# Patient Record
Sex: Male | Born: 1965 | Race: Asian | Hispanic: No | Marital: Married
Health system: Southern US, Academic
[De-identification: ages and names within clinical notes are randomized; demographics above are authoritative.]

## PROBLEM LIST (undated history)

## (undated) ENCOUNTER — Encounter: Attending: Nurse Practitioner | Primary: Nurse Practitioner

## (undated) ENCOUNTER — Telehealth

## (undated) ENCOUNTER — Encounter: Attending: Hematology & Oncology | Primary: Hematology & Oncology

## (undated) ENCOUNTER — Ambulatory Visit

## (undated) ENCOUNTER — Ambulatory Visit: Payer: PRIVATE HEALTH INSURANCE

## (undated) ENCOUNTER — Encounter

## (undated) ENCOUNTER — Ambulatory Visit: Payer: MEDICARE

## (undated) ENCOUNTER — Encounter
Attending: Student in an Organized Health Care Education/Training Program | Primary: Student in an Organized Health Care Education/Training Program

## (undated) ENCOUNTER — Ambulatory Visit: Attending: Radiation Oncology | Primary: Radiation Oncology

## (undated) ENCOUNTER — Ambulatory Visit: Payer: BLUE CROSS/BLUE SHIELD

## (undated) ENCOUNTER — Ambulatory Visit: Payer: PRIVATE HEALTH INSURANCE | Attending: Hematology & Oncology | Primary: Hematology & Oncology

## (undated) ENCOUNTER — Encounter: Payer: PRIVATE HEALTH INSURANCE | Attending: Hematology & Oncology | Primary: Hematology & Oncology

## (undated) ENCOUNTER — Ambulatory Visit
Attending: Student in an Organized Health Care Education/Training Program | Primary: Student in an Organized Health Care Education/Training Program

## (undated) ENCOUNTER — Encounter: Attending: Oncology | Primary: Oncology

## (undated) ENCOUNTER — Telehealth: Attending: Hematology & Oncology | Primary: Hematology & Oncology

## (undated) ENCOUNTER — Telehealth: Attending: Internal Medicine | Primary: Internal Medicine

## (undated) ENCOUNTER — Encounter: Attending: Gastroenterology | Primary: Gastroenterology

## (undated) ENCOUNTER — Encounter
Attending: Pharmacist Clinician (PhC)/ Clinical Pharmacy Specialist | Primary: Pharmacist Clinician (PhC)/ Clinical Pharmacy Specialist

## (undated) ENCOUNTER — Telehealth
Attending: Student in an Organized Health Care Education/Training Program | Primary: Student in an Organized Health Care Education/Training Program

## (undated) ENCOUNTER — Encounter: Attending: Pharmacist | Primary: Pharmacist

## (undated) ENCOUNTER — Telehealth: Attending: Surgery | Primary: Surgery

## (undated) ENCOUNTER — Telehealth: Attending: Family | Primary: Family

## (undated) ENCOUNTER — Encounter: Attending: Surgery | Primary: Surgery

## (undated) ENCOUNTER — Encounter: Attending: Diagnostic Radiology | Primary: Diagnostic Radiology

## (undated) ENCOUNTER — Ambulatory Visit
Payer: PRIVATE HEALTH INSURANCE | Attending: Student in an Organized Health Care Education/Training Program | Primary: Student in an Organized Health Care Education/Training Program

## (undated) ENCOUNTER — Ambulatory Visit: Payer: BLUE CROSS/BLUE SHIELD | Attending: Hematology & Oncology | Primary: Hematology & Oncology

## (undated) ENCOUNTER — Other Ambulatory Visit

## (undated) ENCOUNTER — Ambulatory Visit: Payer: PRIVATE HEALTH INSURANCE | Attending: Acute Care | Primary: Acute Care

## (undated) ENCOUNTER — Inpatient Hospital Stay: Payer: PRIVATE HEALTH INSURANCE

## (undated) ENCOUNTER — Telehealth: Attending: Nurse Practitioner | Primary: Nurse Practitioner

## (undated) ENCOUNTER — Ambulatory Visit: Payer: PRIVATE HEALTH INSURANCE | Attending: Surgery | Primary: Surgery

## (undated) ENCOUNTER — Ambulatory Visit: Attending: Hematology & Oncology | Primary: Hematology & Oncology

## (undated) ENCOUNTER — Ambulatory Visit: Payer: Medicare (Managed Care)

## (undated) ENCOUNTER — Encounter: Attending: Radiation Oncology | Primary: Radiation Oncology

## (undated) ENCOUNTER — Ambulatory Visit: Payer: PRIVATE HEALTH INSURANCE | Attending: Nurse Practitioner | Primary: Nurse Practitioner

---

## 2015-02-07 ENCOUNTER — Ambulatory Visit
Admission: RE | Admit: 2015-02-07 | Discharge: 2015-02-07 | Disposition: A | Discharge status: Home / Self Care | Payer: Self-pay | Source: Ambulatory Visit | Attending: Internal Medicine | Admitting: Internal Medicine

## 2015-02-07 ENCOUNTER — Other Ambulatory Visit: Payer: Self-pay | Admitting: Internal Medicine

## 2015-02-07 DIAGNOSIS — Z111 Encounter for screening for respiratory tuberculosis: Secondary | ICD-10-CM

## 2015-02-07 DIAGNOSIS — R05 Cough: Secondary | ICD-10-CM

## 2015-02-07 DIAGNOSIS — R059 Cough, unspecified: Secondary | ICD-10-CM

## 2017-03-09 IMAGING — CR DG CHEST 2V
2 series · 2 of 2 positions shown · non-contrast
Comparison: None.

CLINICAL DATA: Evaluate for TB.  Cough.

EXAM:
CHEST  2 VIEW

[w chest pa]
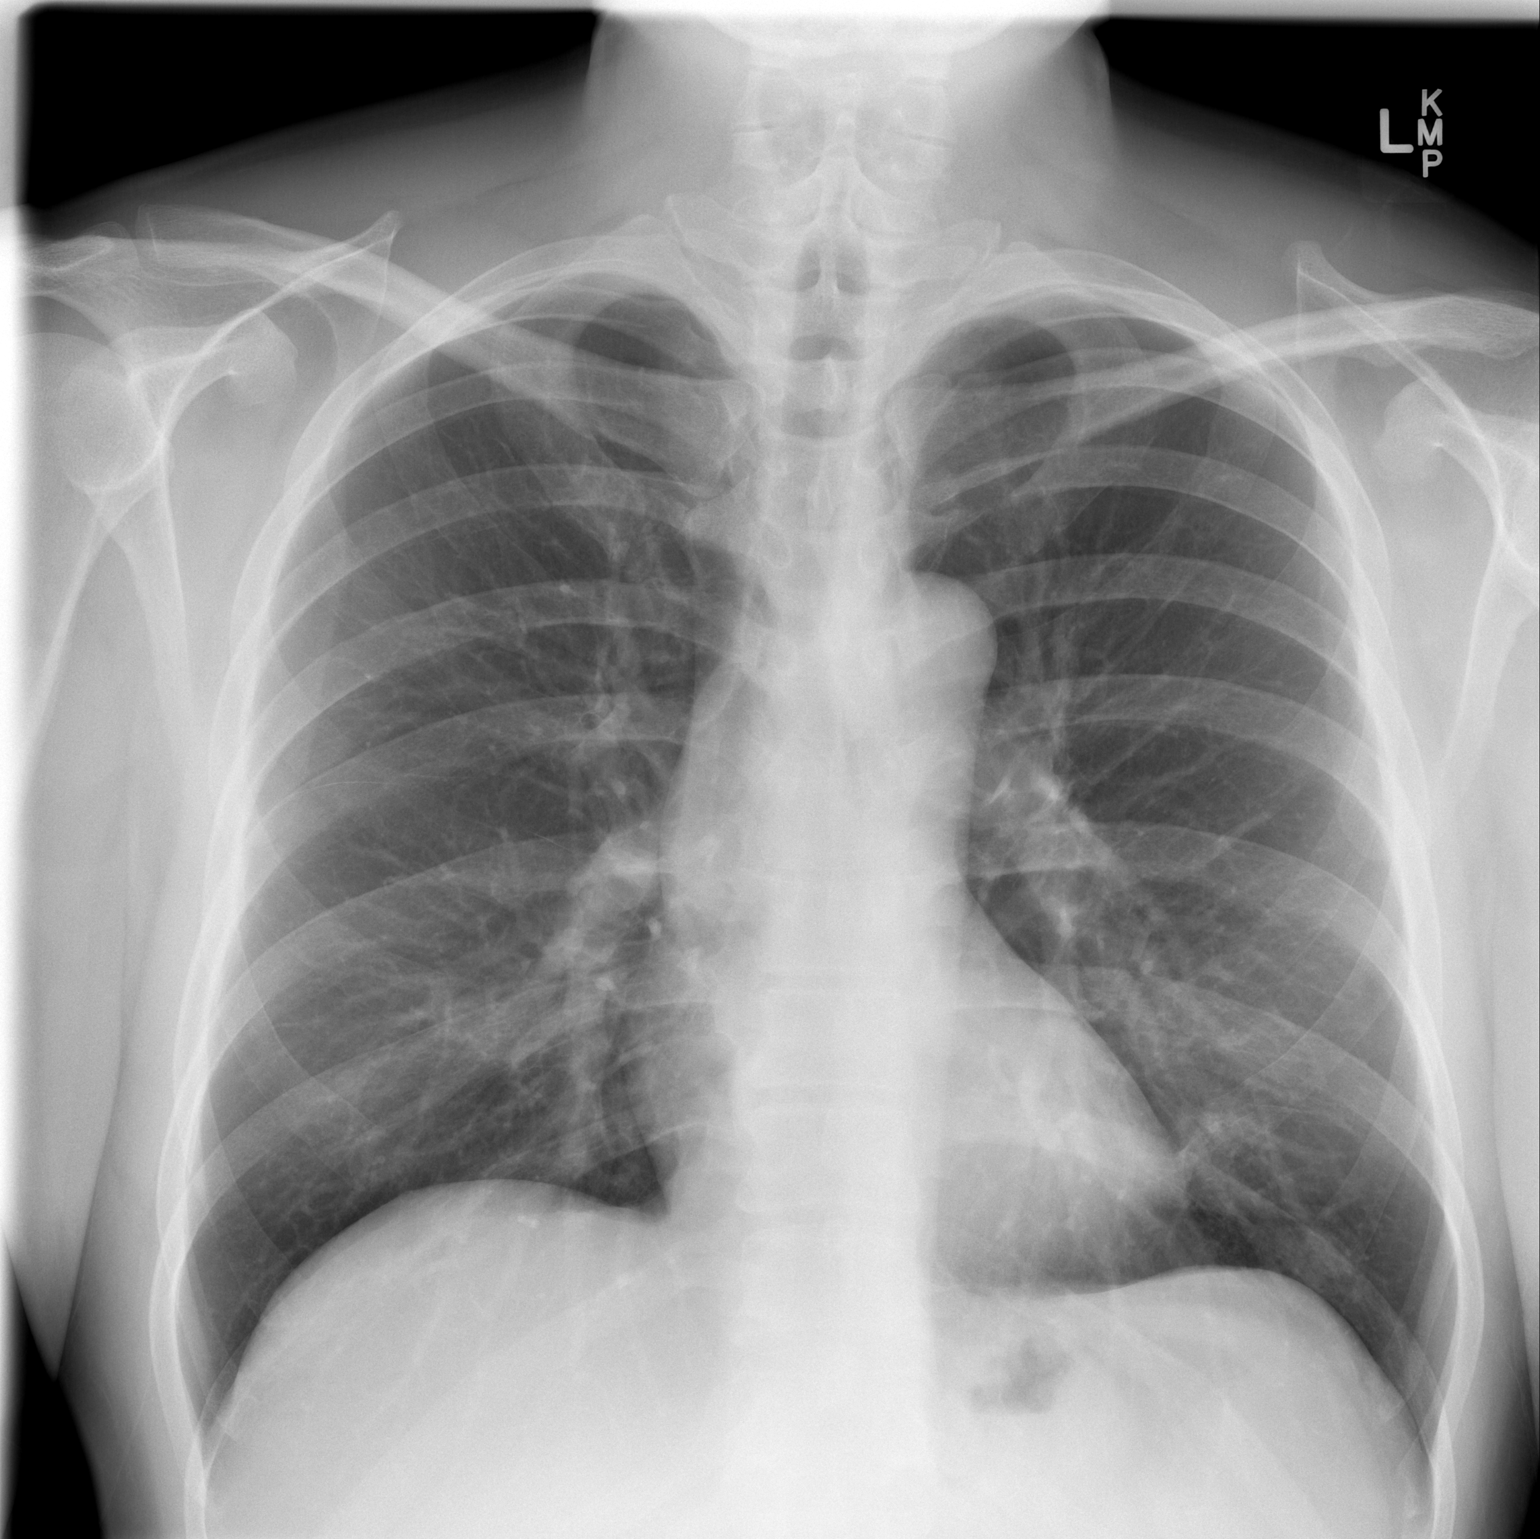

[w chest lat]
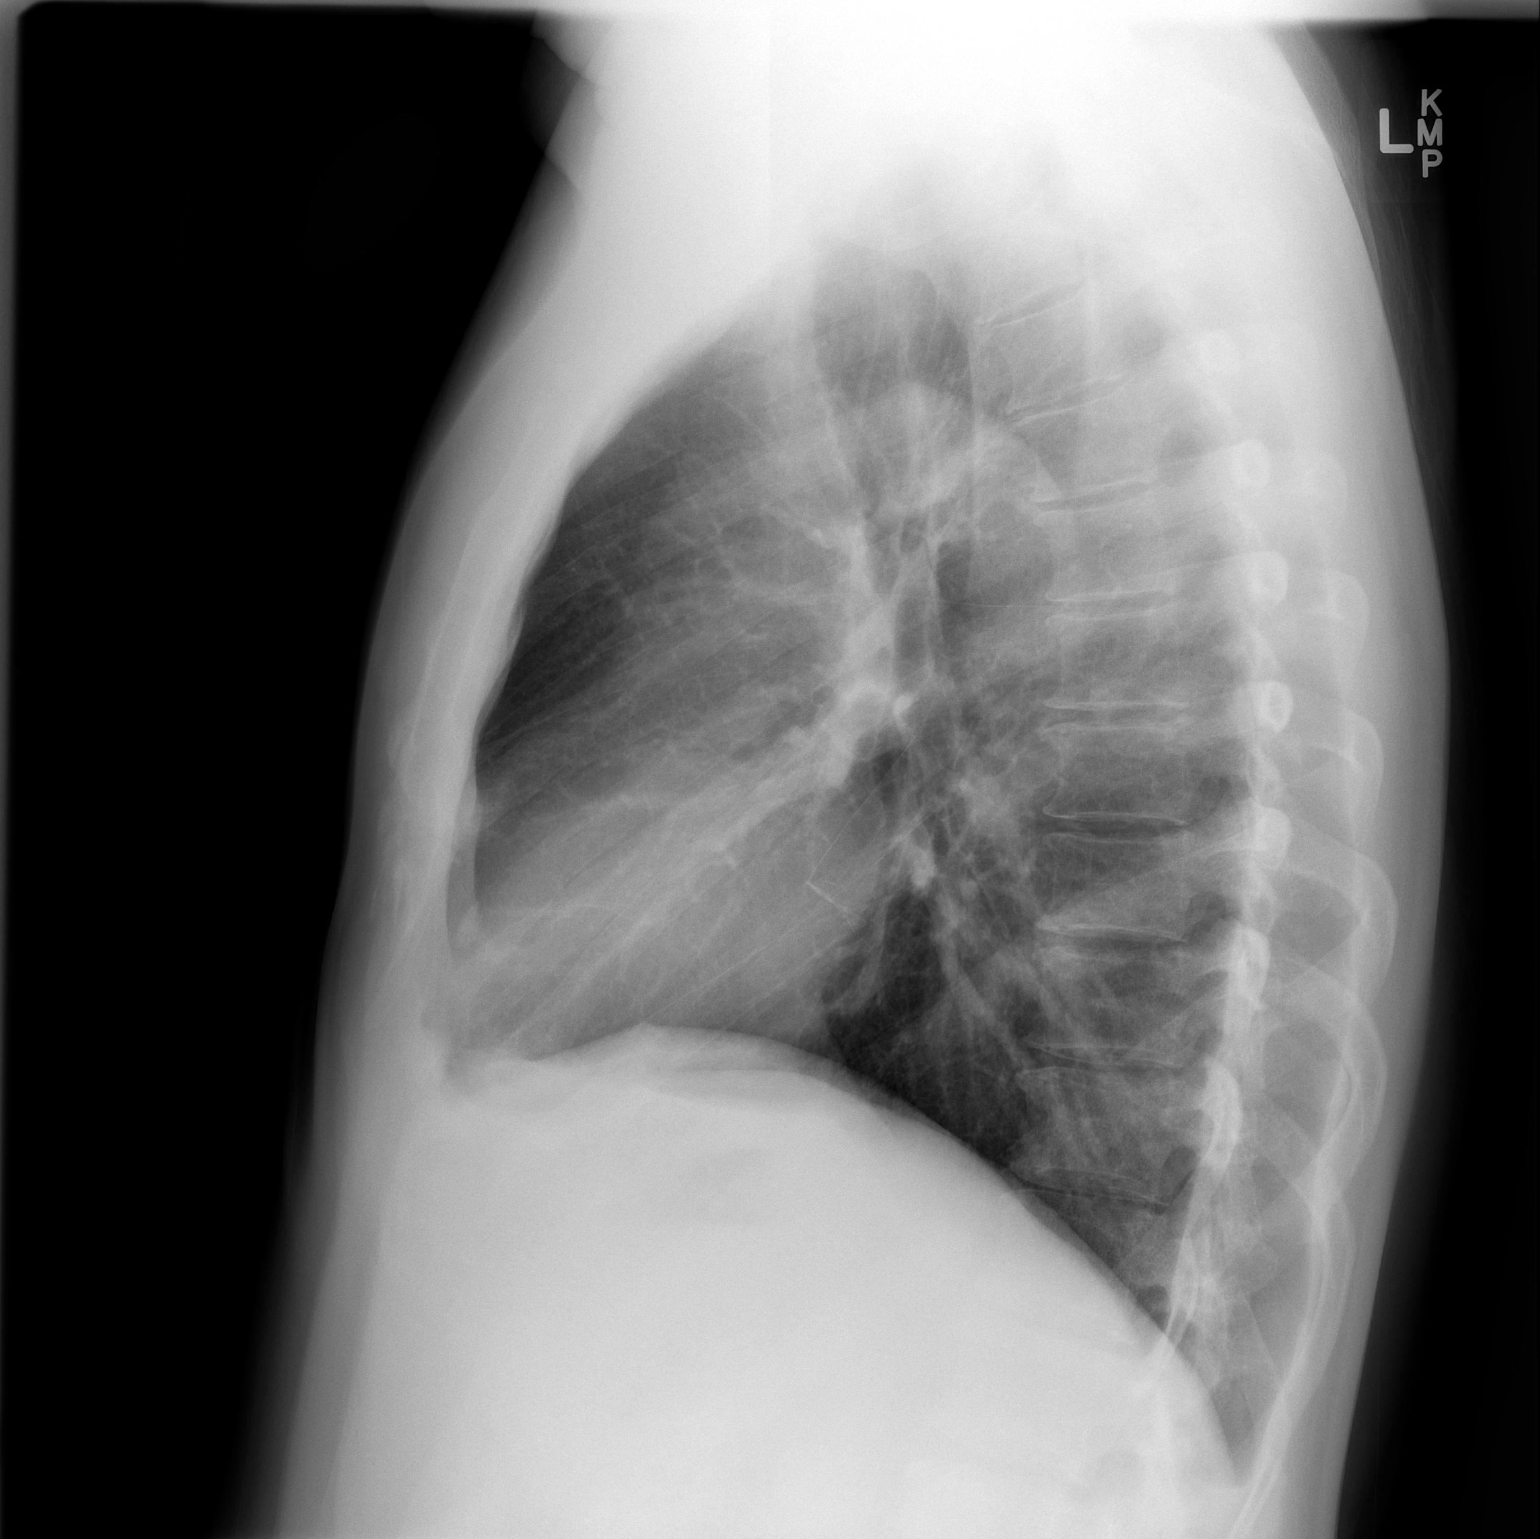

[2 of 2 positions shown; findings below may reference images not displayed]

FINDINGS: Mediastinum hilar structures are normal. Mild lingular subsegmental
atelectasis versus mild infiltrate. No pleural effusion or
pneumothorax. Heart size normal.
IMPRESSION: Mild lingular subsegmental atelectasis versus mild infiltrate .

## 2017-09-30 ENCOUNTER — Telehealth: Payer: Self-pay | Admitting: Student in an Organized Health Care Education/Training Program

## 2017-10-01 NOTE — Telephone Encounter (Signed)
err

## 2018-02-23 ENCOUNTER — Encounter
Admit: 2018-02-23 | Discharge: 2018-02-23 | Payer: PRIVATE HEALTH INSURANCE | Attending: Student in an Organized Health Care Education/Training Program | Primary: Student in an Organized Health Care Education/Training Program

## 2018-02-23 ENCOUNTER — Encounter: Admit: 2018-02-23 | Discharge: 2018-02-23 | Payer: PRIVATE HEALTH INSURANCE

## 2018-02-23 DIAGNOSIS — Z0000 Encounter for general adult medical examination without abnormal findings: Secondary | ICD-10-CM

## 2018-02-23 DIAGNOSIS — Z1211 Encounter for screening for malignant neoplasm of colon: Principal | ICD-10-CM

## 2018-02-23 DIAGNOSIS — Z8739 Personal history of other diseases of the musculoskeletal system and connective tissue: Secondary | ICD-10-CM

## 2018-02-23 DIAGNOSIS — Z13220 Encounter for screening for lipoid disorders: Secondary | ICD-10-CM

## 2018-02-23 DIAGNOSIS — M1A09X0 Idiopathic chronic gout, multiple sites, without tophus (tophi): Secondary | ICD-10-CM

## 2018-02-25 MED ORDER — ALLOPURINOL 100 MG TABLET
ORAL_TABLET | Freq: Every day | ORAL | 3 refills | 0.00000 days | Status: SS
Start: 2018-02-25 — End: 2018-03-18

## 2018-02-25 MED ORDER — COLCHICINE 0.6 MG TABLET
ORAL_TABLET | Freq: Every day | ORAL | 2 refills | 0 days | Status: CP
Start: 2018-02-25 — End: 2018-08-10

## 2018-03-04 ENCOUNTER — Encounter: Admit: 2018-03-04 | Discharge: 2018-03-04 | Payer: PRIVATE HEALTH INSURANCE

## 2018-03-06 ENCOUNTER — Encounter: Admit: 2018-03-06 | Discharge: 2018-03-07 | Payer: PRIVATE HEALTH INSURANCE

## 2018-03-06 ENCOUNTER — Encounter: Admit: 2018-03-06 | Discharge: 2018-03-07 | Payer: PRIVATE HEALTH INSURANCE | Attending: Surgery | Primary: Surgery

## 2018-03-06 DIAGNOSIS — C184 Malignant neoplasm of transverse colon: Principal | ICD-10-CM

## 2018-03-06 DIAGNOSIS — C189 Malignant neoplasm of colon, unspecified: Principal | ICD-10-CM

## 2018-03-06 DIAGNOSIS — Z8739 Personal history of other diseases of the musculoskeletal system and connective tissue: Secondary | ICD-10-CM

## 2018-03-07 ENCOUNTER — Encounter
Admit: 2018-03-07 | Discharge: 2018-03-07 | Disposition: A | Discharge status: Home / Self Care | Payer: PRIVATE HEALTH INSURANCE | Attending: Emergency Medicine

## 2018-03-10 ENCOUNTER — Encounter
Admit: 2018-03-10 | Discharge: 2018-03-10 | Payer: PRIVATE HEALTH INSURANCE | Attending: Anatomic Pathology | Primary: Anatomic Pathology

## 2018-03-10 DIAGNOSIS — C189 Malignant neoplasm of colon, unspecified: Principal | ICD-10-CM

## 2018-03-10 MED ORDER — POLYETHYLENE GLYCOL 3350 17 GRAM/DOSE ORAL POWDER
0 refills | 0 days | Status: SS
Start: 2018-03-10 — End: 2018-03-18

## 2018-03-10 MED ORDER — NEOMYCIN 500 MG TABLET
ORAL_TABLET | 0 refills | 0 days | Status: SS
Start: 2018-03-10 — End: 2018-03-18

## 2018-03-10 MED ORDER — ERYTHROMYCIN 500 MG TABLET
ORAL_TABLET | 0 refills | 0 days | Status: SS
Start: 2018-03-10 — End: 2018-03-18

## 2018-03-10 MED ORDER — BISACODYL 5 MG TABLET
ORAL_TABLET | 0 refills | 0 days | Status: SS
Start: 2018-03-10 — End: 2018-03-18

## 2018-03-16 DIAGNOSIS — C187 Malignant neoplasm of sigmoid colon: Principal | ICD-10-CM

## 2018-03-17 ENCOUNTER — Encounter
Admit: 2018-03-17 | Discharge: 2018-03-20 | Disposition: A | Discharge status: Home / Self Care | Payer: PRIVATE HEALTH INSURANCE | Attending: Student in an Organized Health Care Education/Training Program | Admitting: Surgery

## 2018-03-17 ENCOUNTER — Ambulatory Visit
Admit: 2018-03-17 | Discharge: 2018-03-20 | Disposition: A | Discharge status: Home / Self Care | Payer: PRIVATE HEALTH INSURANCE | Admitting: Surgery

## 2018-03-17 DIAGNOSIS — C187 Malignant neoplasm of sigmoid colon: Principal | ICD-10-CM

## 2018-03-20 MED ORDER — OXYCODONE 5 MG TABLET
ORAL_TABLET | ORAL | 0 refills | 0.00000 days | Status: CP | PRN
Start: 2018-03-20 — End: 2018-03-25

## 2018-03-27 ENCOUNTER — Encounter: Admit: 2018-03-27 | Discharge: 2018-03-28 | Payer: PRIVATE HEALTH INSURANCE | Attending: Surgery | Primary: Surgery

## 2018-03-27 ENCOUNTER — Encounter
Admit: 2018-03-27 | Discharge: 2018-03-28 | Payer: PRIVATE HEALTH INSURANCE | Attending: Hematology & Oncology | Primary: Hematology & Oncology

## 2018-03-27 DIAGNOSIS — D500 Iron deficiency anemia secondary to blood loss (chronic): Secondary | ICD-10-CM

## 2018-03-27 DIAGNOSIS — Z09 Encounter for follow-up examination after completed treatment for conditions other than malignant neoplasm: Principal | ICD-10-CM

## 2018-03-27 DIAGNOSIS — C187 Malignant neoplasm of sigmoid colon: Principal | ICD-10-CM

## 2018-03-31 MED ORDER — CAPECITABINE 500 MG TABLET
ORAL_TABLET | Freq: Two times a day (BID) | ORAL | 3 refills | 0.00000 days | Status: CP
Start: 2018-03-31 — End: 2018-04-01

## 2018-03-31 MED ORDER — CAPECITABINE 500 MG TABLET: 850 mg/m2 | tablet | Freq: Two times a day (BID) | 3 refills | 0 days | Status: AC

## 2018-04-01 MED ORDER — CAPECITABINE 500 MG TABLET
ORAL_TABLET | Freq: Two times a day (BID) | ORAL | 3 refills | 0 days | Status: CP
Start: 2018-04-01 — End: 2018-06-29
  Filled 2018-04-07: qty 112, 14d supply, fill #0

## 2018-04-01 NOTE — Unmapped (Signed)
Updated prescription requested from [facility] for full cycle of capecitabine (to be given as part of CapeOx regimen). Prescription routed as requested.    Care coordination: 10 minutes    Konrad Penta, PharmD, BCOP, CPP  Clinical Pharmacist Practitioner, Gastrointestinal Oncology  Pager: [phone]

## 2018-04-02 NOTE — Unmapped (Signed)
[facility] Special[facility] Medication Referral: PA APPROVED    Medication (Brand/Generic): CAPECITABINE    Initial Test Claim completed with resulted information below:  No PA required  Patient ABLE to fill at [facility]  Insurance Company:  BCBS  Anticipated Copay: $0  Is anticipated copay with a copay card or grant? NO    As Co-pay is under $25 defined limit, per policy there will be no further investigation of need for financial assistance at this time unless patient requests. This referral has been communicated to the provider and handed off to the [facility] team for further processing and filling of prescribed medication.   ______________________________________________________________________  Please utilize this referral for viewing purposes as it will serve as the central location for all relevant documentation and updates.

## 2018-04-06 MED ORDER — FEBUXOSTAT 40 MG TABLET
ORAL_TABLET | Freq: Every day | ORAL | 3 refills | 0.00000 days | Status: CP
Start: 2018-04-06 — End: 2018-04-06

## 2018-04-06 MED ORDER — FEBUXOSTAT 40 MG TABLET: 40 mg | tablet | Freq: Every day | 3 refills | 0 days | Status: AC

## 2018-04-06 NOTE — Unmapped (Signed)
Test claim sent for benefits investigation only for medication febuxostat.    Care coordination: 5 minutes    Konrad Penta, PharmD, BCOP, CPP  Clinical Pharmacist Practitioner, Gastrointestinal Oncology  Pager: [phone]

## 2018-04-06 NOTE — Unmapped (Signed)
Pharmacy Telephone Encounter    I called and spoke with [name] regarding drug-drug and drug-herbal interactions. Instructed the patient to stop taking his allopurinol today as it will potentially interact with and decrease the efficacy of his capecitabine, which he will start taking on 04/09/18 in the evening. Pharmacy team will reach out to Dr. Forbes Cellar with recommendations to start febuxostat for gout prevention. Drug-herbal interactions were not identified but since these products have a lack of strict regulation and limited data with our chemotherapy plan, it is unknown exactly how safety/efficacy will be impacted so this point was reiterated (also with patient on 03/27/18).    Daleen Snook, PharmD  PGY2 Hematology/Oncology Pharmacy Resident  Pager: [phone]    I discussed the patient with the resident and agree with the plan as documented.    Konrad Penta, PharmD, BCOP, CPP  Clinical Pharmacist Practitioner, Gastrointestinal Oncology  Pager: [phone]

## 2018-04-06 NOTE — Unmapped (Signed)
[facility]) Pharmacy Onboarding    Mr.Mosey is a 52 y.o. male who was counseled on initiation of CAPEOX (capecitabine specialty medication). Patient's date of birth/HIPAA verified.    Diagnosis: Colon Cancer    Related to appropriateness of therapy, the following patient information was reviewed: diagnosis, contraindications, precautions, comorbidities, and allergies. Patient will receive a Lexi-Comp drug information handout with shipment. Plan to convert patient from allopurinol to febuxostat to avoid drug-drug interaction with capecitabine.    Patient Specific Needs     ?? Patient speaks [place], an interpreter is not needed.  ?? Patient is able to read and understand education materials at a high school level or above.  ?? Patient does not have any cultural barriers.  ?? Patient has no cognitive or physical impairments.    Medication Acquisition     Anticipated copay discussed with patient: $0    MAP Involvement:  ?? Prior authorization: Yes   ?? Copay assistance: n/a  ?? Grant assistance: n/a    SSC and Delivery Information     Patient will receive a medication information handout and a Herbalist with their shipment.    Patient informed that shipment will be delivered on 04/08/18 via UPS and patient will not have to sign for the package.    Delivery address verified:  [address]  [place]  [address]    Reviewed the services that the [facility] will provide and how to contact them [phone] option 4).  A representative from the pharmacy will contact the patient to remind them to set up their refill 7-10 days prior to the patient running out of medication. Emphasized the importance of answering or returning phone calls from the [facility] to prevent delays in therapy.  The pharmacy MUST speak to the patient to schedule the refill.    Patient informed that a pharmacist is available Monday through Friday 8:30 am to 4:30 pm. A pharmacist is available via pager 24/7 to answer any clinical questions regarding the Medication.    Medication Education     Please see progress note from 03/27/18 for medication-specific counseling session.    Daleen Snook, PharmD  PGY2 Hematology/Oncology Pharmacy Resident  Pager: [phone]    I discussed the patient with the resident and agree with the plan as documented.    Konrad Penta, PharmD, BCOP, CPP  Clinical Pharmacist Practitioner, Gastrointestinal Oncology  Pager: [phone]

## 2018-04-07 MED ORDER — FEBUXOSTAT 40 MG TABLET
ORAL_TABLET | Freq: Every day | ORAL | 1 refills | 0.00000 days | Status: CP
Start: 2018-04-07 — End: 2018-05-07

## 2018-04-07 MED FILL — CAPECITABINE 500 MG TABLET: 14 days supply | Qty: 112 | Fill #0 | Status: AC

## 2018-04-07 NOTE — Unmapped (Signed)
Patient plans to talk with PCP prior to initiating urate lowering therapy, but Uloric (febuxostat) was sent to patient's preferred pharmacy ([facility]). Please see telephone encounter note from 04/06/18 for further details.    Counseled on the increased risk of CV-related death in patients treated with febuxostat if they have established CV disease (compared to those treated with allopurinol). Counseled on the need for continued gout flare prophylaxis when initiating febuxostat in order to help prevent gout flares.    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 Also, he is not going to begin taking turmeric or the herbal product ashwagandha given the unknown effects related to our cancer treatment plan.    Care coordination: 15 minutes    Konrad Penta, PharmD, BCOP, CPP  Clinical Pharmacist Practitioner, Gastrointestinal Oncology  Pager: [phone]

## 2018-04-07 NOTE — Unmapped (Signed)
Sent request to [facility] to schedule delivery of capecitabine with arrival expected on 04/08/18 (details included below):    [facility] Scheduling Note    Specialty Medication(s) to be Shipped (name and strength):   Hematology/Oncology: Capecitabine 500 mg    Other medication(s) to be shipped: N/A    Mark Calhoun, DOB: 02-05-1966  Phone: [phone] (home) [phone] (work)  Shipping Address: [address]  [place] [address]    Date Patient Should Receive Medication: 04/08/18    Delivery notes:    Signature not required   UPS    Care coordination: 5 minutes    Konrad Penta, PharmD, BCOP, CPP  Clinical Pharmacist Practitioner, Gastrointestinal Oncology  Pager: [phone]

## 2018-04-09 ENCOUNTER — Encounter: Admit: 2018-04-09 | Discharge: 2018-04-09 | Payer: PRIVATE HEALTH INSURANCE

## 2018-04-09 ENCOUNTER — Encounter
Admit: 2018-04-09 | Discharge: 2018-04-09 | Payer: PRIVATE HEALTH INSURANCE | Attending: Hematology & Oncology | Primary: Hematology & Oncology

## 2018-04-09 DIAGNOSIS — C187 Malignant neoplasm of sigmoid colon: Principal | ICD-10-CM

## 2018-04-09 DIAGNOSIS — D500 Iron deficiency anemia secondary to blood loss (chronic): Secondary | ICD-10-CM

## 2018-04-09 LAB — MAGNESIUM: Magnesium:MCnc:Pt:Ser/Plas:Qn:: 2

## 2018-04-09 LAB — CBC W/ AUTO DIFF
BASOPHILS ABSOLUTE COUNT: 0 10*9/L (ref 0.0–0.1)
BASOPHILS RELATIVE PERCENT: 0.8 %
EOSINOPHILS ABSOLUTE COUNT: 0.2 10*9/L (ref 0.0–0.4)
EOSINOPHILS RELATIVE PERCENT: 3.9 %
HEMATOCRIT: 33.4 % — ABNORMAL LOW (ref 41.0–53.0)
HEMOGLOBIN: 9.6 g/dL — ABNORMAL LOW (ref 13.5–17.5)
LARGE UNSTAINED CELLS: 3 % (ref 0–4)
LYMPHOCYTES ABSOLUTE COUNT: 1 10*9/L — ABNORMAL LOW (ref 1.5–5.0)
LYMPHOCYTES RELATIVE PERCENT: 25.2 %
MEAN CORPUSCULAR HEMOGLOBIN CONC: 28.8 g/dL — ABNORMAL LOW (ref 31.0–37.0)
MEAN CORPUSCULAR HEMOGLOBIN: 19.7 pg — ABNORMAL LOW (ref 26.0–34.0)
MEAN CORPUSCULAR VOLUME: 68.3 fL — ABNORMAL LOW (ref 80.0–100.0)
MEAN PLATELET VOLUME: 6.3 fL — ABNORMAL LOW (ref 7.0–10.0)
MONOCYTES ABSOLUTE COUNT: 0.3 10*9/L (ref 0.2–0.8)
NEUTROPHILS ABSOLUTE COUNT: 2.5 10*9/L (ref 2.0–7.5)
NEUTROPHILS RELATIVE PERCENT: 61.1 %
RED BLOOD CELL COUNT: 4.88 10*12/L (ref 4.50–5.90)
RED CELL DISTRIBUTION WIDTH: 22.4 % — ABNORMAL HIGH (ref 12.0–15.0)
WBC ADJUSTED: 4.1 10*9/L — ABNORMAL LOW (ref 4.5–11.0)

## 2018-04-09 LAB — RED BLOOD CELL COUNT: Lab: 4.88

## 2018-04-09 LAB — COMPREHENSIVE METABOLIC PANEL
ALBUMIN: 4.8 g/dL (ref 3.5–5.0)
ALKALINE PHOSPHATASE: 52 U/L (ref 38–126)
ALT (SGPT): 24 U/L (ref 19–72)
ANION GAP: 10 mmol/L (ref 7–15)
AST (SGOT): 26 U/L (ref 19–55)
BILIRUBIN TOTAL: 0.8 mg/dL (ref 0.0–1.2)
BLOOD UREA NITROGEN: 14 mg/dL (ref 7–21)
BUN / CREAT RATIO: 15
CALCIUM: 9.8 mg/dL (ref 8.5–10.2)
CO2: 28 mmol/L (ref 22.0–30.0)
CREATININE: 0.93 mg/dL (ref 0.70–1.30)
EGFR CKD-EPI AA MALE: >90 mL/min/{1.73_m2} (ref >=60)
EGFR CKD-EPI NON-AA MALE: >90 mL/min/{1.73_m2} (ref >=60)
GLUCOSE RANDOM: 99 mg/dL (ref 65–179)
POTASSIUM: 4.6 mmol/L (ref 3.5–5.0)
PROTEIN TOTAL: 7.8 g/dL (ref 6.5–8.3)
SODIUM: 141 mmol/L (ref 135–145)

## 2018-04-09 LAB — CHLORIDE: Chloride:SCnc:Pt:Ser/Plas:Qn:: 103

## 2018-04-09 LAB — CARCINOEMBRYONIC ANTIGEN: Carcinoembryonic Ag:MCnc:Pt:Ser/Plas:Qn:: 1.6

## 2018-04-09 LAB — OVALOCYTES

## 2018-04-09 MED ORDER — PROCHLORPERAZINE MALEATE 10 MG TABLET
ORAL_TABLET | Freq: Four times a day (QID) | ORAL | 6 refills | 0.00000 days | Status: CP | PRN
Start: 2018-04-09 — End: ?

## 2018-04-09 MED ORDER — ONDANSETRON HCL 8 MG TABLET
ORAL_TABLET | Freq: Two times a day (BID) | ORAL | 6 refills | 0 days | Status: CP | PRN
Start: 2018-04-09 — End: 2019-04-09

## 2018-04-09 NOTE — Unmapped (Signed)
VS taken. Labs drawn via [facility] PIV.    PIV flushed and brisk blood return     Premeds and IV fluids given.    Pt received Oxaliplatin - no adverse events noted.     Premeds given.    Pt received Fereheme infusion - no adverse events noted.     AVS refused.    Pt stable and ambulated independently from clinic.

## 2018-04-09 NOTE — Unmapped (Signed)
[facility] MEDICAL ONCOLOG[facility]     PRIMARY CARE PROVIDER:  Lynnell Jude, MD  [address]  [place] [address]    CONSULTING PROVIDERS  Dr. Nemiah Commander, [facility] Colorectal Surgery   ____________________________________________________________________    CANCER HISTORY  Diagnosis 03/2018 stage III colon cancer presenting with severe anemia   03/17/18 lap left/sigmoid colectomy: pT3N1b (3/42) moderately differentiated adenocarcinoma, discontig tumor nodules present, intermediate tumor budding,  IHC intact for MMR enzymes (MLH1, MSH2, MSH6, PMS2). preop CEA 4.2   04/09/18: CapeOX (planned 3 month duration)  ____________________________________________________________________    ASSESSMENT  1. Stage IIIB MSS sigmoid colon cancer  2. Indeterminate liver hypodensities, likely benign  3,  Iron deficiency anemia  4. Gout, imrpvoed  5. ECOG PS 0      RECOMMENDATIONS  1. Adjuvant chemotherapy: 3 months of CapeOX. Consider additional 3 months of single cape  2. Liver MRI at completion of 3 month adjuvant therapy  3. ron infusion with cycle 1  3 rtc in 3 weeks for cycle 2    DISCUSSION  Reviewed capeox adverse effects, what to expect with treatment, and how to take again. Rx [facility] ondansetron and [facility]     Plan for IV iron given very low ferritin even posttransfusion    We will readdress the 3 v 6 month question as we near the end of the 3 months        ______________________________________________________________________  HISTORY     Mark Calhoun is seen today at the [facility] for consultation regarding colon cancer at the request of Dr. Elenore Rota.     Mark Calhoun is feeling well. Eating very health--lots of veggies and fruit. He is walking 2-3 miles 1 or two times a day. No trouble with dyspnea with that. Incisions dont hurt. His energy is getting closer to his normal self.   Bowels doing well-- two soft bm a day which is up from usual 1, but not a problem for him.     His gout on his foot has resolved. Feels normal again.    MEDICAL HISTORY  Gout-- has only had a few flares over the years.     No Known Allergies  Medications reviewed in the EMR    SOCIAL HISTORY  Current: coems with his wife. 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 His brother [name] is my partner in medical oncology. Shawndell is married and has two teenage daughters (17 and 69 as of 03/2018).   REVIEW OF SYSTEMS  The remainder of a comprehensive 10 systems review is negative.    PHYSICAL EXAM  BP 130/77  - Temp 36.8 ??C (98.3 ??F) (Oral)  - Wt 91.1 kg (200 lb 12.8 oz)  - SpO2 100%  - BMI 28.02 kg/m??    GENERAL: well developed, well nourished, in no distress  PSYCH: full and appropriate range of affect with good insight and judgement  HEENT: NCAT, pupils equal, sclerae anicteric,   GI:  Incisions essentially healed. Still with some suture glue adhereent. No erythema or drainage  EXT: nl  SKIN: No rashes    OBJECTIVE DATA    hgb improing but still in the 9s.   Everything else is great.     RADIOLOGY RESULTS (scans are personally reviewed by me)  none

## 2018-04-09 NOTE — Unmapped (Signed)
Pharmacy: First Cycle Chemotherapy Patient Education    Chemotherapy regimen/agents: capecitabine/oxaliplatin  Day 1 of chemotherapy: 04/09/18    I counseled Mark Calhoun about his new chemotherapy regimen.  He was previously counseled about his capecitabine by Konrad Penta.      Counseling:     Handout(s) provided from:  [facility] patient education [employer].    Adverse effects discussed included the following:      Neutropenia/Febrile Neutropenia  Thrombocytopenia  Anemia  Nausea/Vomiting  Diarrhea  Mucositis  Palmar/Plantar Erythrodysthesia (Hand/foot syndrome and skin reaction)  Peripheral Neuropathy  Temperature-dependent neuropathy     I discussed use of antiemetic agents and need to be aggressive with these agents if starting to feel ill.  We also reviewed less common, but serious side effects that require immediate treatment and physician notification (e.g. fever, bleeding, clots).      A/P:      1. Counseling completed. Brother and wife were present.     2.  Patient has appropriate supportive care medications for home.      3.  Patient's medication profile was scanned for drug interactions.      The patient verbalized understanding.      Approximate time spent with patient: 20 mins.  Greater than 50% of my time was spent in direct face-to-face counseling    Thank you, please feel free to page with any questions or concerns.  Gardner Candle, PharmD, BCOP, CPP

## 2018-04-13 NOTE — Unmapped (Signed)
Called pt to check on how his first infusion went and how his first few days of capecitabine are. He reports doing well. Had some nausea yesterday, but went away on its own. He has not required use of any anti-emetics but does have them if he needs them. He had a small amount of diarrhea, but attributes this to his colchicine and it has resolved. He has some fatigue and taste changes, but otherwise feels okay.    He denies any further questions or concerns at this time but does have my contact information if he needs anything.

## 2018-04-17 NOTE — Unmapped (Signed)
Please click on Chart from this encounter, double click on this encounter from the chart,  and follow these instructions.     See Patient-Level Documents below. Locate recently scanned External Records labeled Print and Sign. Click hyperlink to view and print paperwork. Please complete within 2 business days.     Once completed, please place in nearest Internal Medicine outbox to be faxed.     Please let me know if I can do anything to help you. Thanks.

## 2018-04-21 MED FILL — CAPECITABINE 500 MG TABLET: 1 days supply | Qty: 1 | Fill #0 | Status: AC

## 2018-04-21 MED FILL — CAPECITABINE 500 MG TABLET: ORAL | 1 days supply | Qty: 1 | Fill #0

## 2018-04-21 NOTE — Unmapped (Signed)
[facility] Refill Coordination Note  Specialty Medication(s): capecitabine 500mg  (One tablet to replace lost tablet)  Additional Medications shipped: none    Mark Calhoun, DOB: 1965-12-19  Phone: [phone] (home) [phone] (work), Alternate phone contact: N/A  Phone or address changes today?: No  All above HIPAA information was verified with patient.  Shipping Address: [address]  [place] [address]   Insurance changes? No    Completed refill call assessment today to schedule patient's medication shipment from the [facility] [phone]).      Confirmed the medication and dosage are correct and have not changed: Yes, regimen is correct and unchanged.    Confirmed patient started or stopped the following medications in the past month:  No, there are no changes reported at this time.    Are you tolerating your medication?:  Mark Calhoun reports tolerating the medication.    ADHERENCE        Did you miss any doses in the past 4 weeks? No missed doses reported.    FINANCIAL/SHIPPING    Delivery Scheduled: Yes, Expected medication delivery date: 04/22/18 (shipiping one tablet to replace lost tablet)     Medication will be delivered via UPS to the home address in [facility].    The patient will receive a drug information handout for each medication shipped and additional FDA Medication Guides as required.      Lafe did not have any additional questions at this time.    We will follow up with patient monthly for standard refill processing and delivery.      Thank you,  Roderic Palau   [facility] Specialty Pharmacist

## 2018-04-21 NOTE — Unmapped (Signed)
Replacement script for damaged capecitabine dose sent to [facility] as requested.    Care coordination: 5 minutes    Konrad Penta, PharmD, BCOP, CPP  Clinical Pharmacist Practitioner, Gastrointestinal Oncology  Pager: [phone]

## 2018-04-23 MED ORDER — CAPECITABINE 500 MG TABLET
ORAL_TABLET | Freq: Once | ORAL | 0 refills | 0.00000 days | Status: CP
Start: 2018-04-23 — End: 2018-04-30

## 2018-04-27 MED FILL — CAPECITABINE 500 MG TABLET: ORAL | 21 days supply | Qty: 112 | Fill #1

## 2018-04-27 MED FILL — CAPECITABINE 500 MG TABLET: 21 days supply | Qty: 112 | Fill #1 | Status: AC

## 2018-04-27 NOTE — Unmapped (Signed)
Sending out the full 14 day course for him for delivery Tuesday 10/29    [facility] Refill Coordination Note    Specialty Medication(s) to be Shipped:   Hematology/Oncology: Capecitabine 500mg     Other medication(s) to be shipped: n/a     [name], DOB: Dec 23, 1965  Phone: [phone] (home) [phone] (work)      All above HIPAA information was verified with patient.     Completed refill call assessment today to schedule patient's medication shipment from the [facility] [phone]).       Specialty medication(s) and dose(s) confirmed: Regimen is correct and unchanged.   Changes to medications: Jamin reports no changes reported at this time.  Changes to insurance: No  Questions for the pharmacist: No    The patient will receive a drug information handout for each medication shipped and additional FDA Medication Guides as required.      DISEASE/MEDICATION-SPECIFIC INFORMATION        patient will start this round on thursday    ADHERENCE        No missed doses reported at this time      MEDICARE PART B DOCUMENTATION     Capecitabine 500mg : Patient has 0 tablets on hand.    SHIPPING     Shipping address confirmed in Epic.     Delivery Scheduled: Yes, Expected medication delivery date: 10/29 via UPS or courier.     Medication will be delivered via UPS to the home address in Epic [facility].    Renette Butters   [facility] Specialty Technician

## 2018-04-30 ENCOUNTER — Other Ambulatory Visit: Admit: 2018-04-30 | Discharge: 2018-04-30 | Payer: PRIVATE HEALTH INSURANCE

## 2018-04-30 ENCOUNTER — Encounter
Admit: 2018-04-30 | Discharge: 2018-04-30 | Payer: PRIVATE HEALTH INSURANCE | Attending: Hematology & Oncology | Primary: Hematology & Oncology

## 2018-04-30 ENCOUNTER — Encounter: Admit: 2018-04-30 | Discharge: 2018-04-30 | Payer: PRIVATE HEALTH INSURANCE

## 2018-04-30 DIAGNOSIS — C187 Malignant neoplasm of sigmoid colon: Principal | ICD-10-CM

## 2018-04-30 LAB — CBC W/ AUTO DIFF
BASOPHILS ABSOLUTE COUNT: 0.1 10*9/L (ref 0.0–0.1)
BASOPHILS RELATIVE PERCENT: 1.8 %
EOSINOPHILS ABSOLUTE COUNT: 0.2 10*9/L (ref 0.0–0.4)
EOSINOPHILS RELATIVE PERCENT: 5.7 %
HEMATOCRIT: 41.2 % (ref 41.0–53.0)
HEMOGLOBIN: 12.7 g/dL — ABNORMAL LOW (ref 13.5–17.5)
LARGE UNSTAINED CELLS: 2 % (ref 0–4)
LYMPHOCYTES ABSOLUTE COUNT: 0.9 10*9/L — ABNORMAL LOW (ref 1.5–5.0)
LYMPHOCYTES RELATIVE PERCENT: 30.7 %
MEAN CORPUSCULAR HEMOGLOBIN CONC: 30.7 g/dL — ABNORMAL LOW (ref 31.0–37.0)
MEAN CORPUSCULAR HEMOGLOBIN: 24.1 pg — ABNORMAL LOW (ref 26.0–34.0)
MEAN CORPUSCULAR VOLUME: 78.5 fL — ABNORMAL LOW (ref 80.0–100.0)
MEAN PLATELET VOLUME: 6.2 fL — ABNORMAL LOW (ref 7.0–10.0)
MONOCYTES ABSOLUTE COUNT: 0.3 10*9/L (ref 0.2–0.8)
NEUTROPHILS RELATIVE PERCENT: 49 %
PLATELET COUNT: 213 10*9/L (ref 150–440)
RED BLOOD CELL COUNT: 5.24 10*12/L (ref 4.50–5.90)
WBC ADJUSTED: 3 10*9/L — ABNORMAL LOW (ref 4.5–11.0)

## 2018-04-30 LAB — POTASSIUM: Potassium:SCnc:Pt:Ser/Plas:Qn:: 4.2

## 2018-04-30 LAB — MAGNESIUM: Magnesium:MCnc:Pt:Ser/Plas:Qn:: 1.9

## 2018-04-30 LAB — BASOPHILS ABSOLUTE COUNT: Lab: 0.1

## 2018-04-30 LAB — SMEAR REVIEW

## 2018-04-30 LAB — CREATININE: CREATININE: 0.93 mg/dL (ref 0.70–1.30)

## 2018-04-30 LAB — EGFR CKD-EPI NON-AA MALE: Lab: >90

## 2018-04-30 NOTE — Unmapped (Signed)
[facility]     PRIMARY CARE PROVIDER:  Lynnell Jude, MD  [address]  [place] [address]    CONSULTING PROVIDERS  Dr. Nemiah Calhoun, [facility]   ____________________________________________________________________    CANCER HISTORY  Diagnosis 03/2018 stage III colon cancer presenting with severe anemia   03/17/18 lap left/sigmoid colectomy: pT3N1b (3/42) moderately differentiated adenocarcinoma, discontig tumor nodules present, intermediate tumor budding,  IHC intact for MMR enzymes (MLH1, MSH2, MSH6, PMS2). preop CEA 4.2   04/09/18: CapeOX (planned 3 month duration)  ____________________________________________________________________    ASSESSMENT  1. Stage IIIB MSS sigmoid colon cancer  2. Indeterminate liver hypodensities, likely benign  3,  Iron deficiency anemia-- resolving  4. Gout, imrpvoed  5. ECOG PS 0      RECOMMENDATIONS  1. Adjuvant chemotherapy: 3 months of CapeOX. Consider additional 3 months of single cape  2. Liver MRI at completion of 3 month adjuvant therapy  3. rtc in 3 weeks for cycle 3; 6 weeks for cycle 4 and visit    DISCUSSION  Doing great so far. Will watch cytopenias but hope to complete on time without dose reduction  Will discuss at our next visit whether to continue the cape out to 6 months. If he has such mild symptoms all throughout, am inclined to recommend it.     ______________________________________________________________________  HISTORY     Mark Calhoun is seen today at the [facility] for consultation regarding colon cancer at the request of Dr. Elenore Calhoun.     Did well with cycle 1 of chemo. Did have oxaliplatin cold dysesthesias that lasted close to a week, werent terrible for him. Some dysesthesias in jaw as well. He had nausea for 3 days that wasn't bad enough to take zofran. No trouble with mouth sores, hand foot soreness. He has been walkign regularly though this was interrupted by 3-4 days of gout flare. Thinks it happened because he skipped a couple colchicine     Overall didn't think the chemo was that bad    MEDICAL HISTORY  Gout-- has only had a few flares over the years.     No Known Allergies  Medications reviewed in the EMR    SOCIAL HISTORY  Current: comes alone. 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 Mark Calhoun is married and has two teenage daughters (17 and 36 as of 03/2018).     REVIEW OF SYSTEMS  The remainder of a comprehensive 10 systems review is negative.    PHYSICAL EXAM  BP 133/84  - Pulse 77  - Temp 36.6 ??C (97.9 ??F)  - Resp 18  - Wt 87.9 kg (193 lb 12.8 oz)  - SpO2 99%  - BMI 27.04 kg/m??    GENERAL: well developed, well nourished, in no distress  PSYCH: full and appropriate range of affect with good insight and judgement  HEENT: NCAT, pupils equal, sclerae anicteric,   EXT: nl  SKIN: No rashes    OBJECTIVE DATA    hgb much better, back to 12s.   ANC 1.4    RADIOLOGY RESULTS (scans are personally reviewed by me)  none

## 2018-04-30 NOTE — Unmapped (Signed)
VS taken. Labs drawn via [place].    Port flushed and brisk blood return     Premeds and IV fluids given.    Pt received Oxaliplatin - no adverse events noted.     AVS refused.    Pt stable and ambulated independently from clinic.

## 2018-05-01 NOTE — Unmapped (Signed)
Newly placed PIV - flushed, brisk blood return, labs drawn and saline locked. Pt tolerated it well.

## 2018-05-18 MED FILL — CAPECITABINE 500 MG TABLET: ORAL | 21 days supply | Qty: 112 | Fill #2

## 2018-05-18 MED FILL — CAPECITABINE 500 MG TABLET: 21 days supply | Qty: 112 | Fill #2 | Status: AC

## 2018-05-18 NOTE — Unmapped (Signed)
[facility] Refill Coordination Note  Specialty Medication(s): Capecitabine 500mg       Mark Calhoun, DOB: 11-Dec-1965  Phone: [phone] (home) [phone] (work), Alternate phone contact: N/A  Phone or address changes today?: No  All above HIPAA information was verified with patient.  Shipping Address: [address]  [place] [address]   Insurance changes? No    Completed refill call assessment today to schedule patient's medication shipment from the [facility] [phone]).      Confirmed the medication and dosage are correct and have not changed: Yes, regimen is correct and unchanged.    Confirmed patient started or stopped the following medications in the past month:  No, there are no changes reported at this time.    Are you tolerating your medication?:  Mark Calhoun reports tolerating the medication.    ADHERENCE  Did you miss any doses in the past 4 weeks? No missed doses reported.    FINANCIAL/SHIPPING    Delivery Scheduled: Yes, Expected medication delivery date: 05/19/2018     Medication will be delivered via UPS to the home address in [facility].    The patient will receive a drug information handout for each medication shipped and additional FDA Medication Guides as required.      Mark Calhoun did not have any additional questions at this time.    We will follow up with patient monthly for standard refill processing and delivery.      Thank you,  Kenneisha Cochrane  Anders Grant   [facility] Specialty Pharmacist

## 2018-05-21 ENCOUNTER — Encounter: Admit: 2018-05-21 | Discharge: 2018-05-22 | Payer: PRIVATE HEALTH INSURANCE

## 2018-05-21 DIAGNOSIS — C187 Malignant neoplasm of sigmoid colon: Principal | ICD-10-CM

## 2018-05-21 LAB — CBC W/ AUTO DIFF
BASOPHILS ABSOLUTE COUNT: 0 10*9/L (ref 0.0–0.1)
BASOPHILS RELATIVE PERCENT: 1.2 %
EOSINOPHILS ABSOLUTE COUNT: 0.1 10*9/L (ref 0.0–0.4)
EOSINOPHILS RELATIVE PERCENT: 4.2 %
HEMOGLOBIN: 12.9 g/dL — ABNORMAL LOW (ref 13.5–17.5)
LARGE UNSTAINED CELLS: 4 % (ref 0–4)
LYMPHOCYTES ABSOLUTE COUNT: 1.3 10*9/L — ABNORMAL LOW (ref 1.5–5.0)
LYMPHOCYTES RELATIVE PERCENT: 39.6 %
MEAN CORPUSCULAR HEMOGLOBIN CONC: 31.6 g/dL (ref 31.0–37.0)
MEAN CORPUSCULAR HEMOGLOBIN: 26.4 pg (ref 26.0–34.0)
MEAN CORPUSCULAR VOLUME: 83.5 fL (ref 80.0–100.0)
MEAN PLATELET VOLUME: 7 fL (ref 7.0–10.0)
MONOCYTES ABSOLUTE COUNT: 0.3 10*9/L (ref 0.2–0.8)
MONOCYTES RELATIVE PERCENT: 8.7 %
NEUTROPHILS ABSOLUTE COUNT: 1.4 10*9/L — ABNORMAL LOW (ref 2.0–7.5)
NEUTROPHILS RELATIVE PERCENT: 42.8 %
PLATELET COUNT: 125 10*9/L — ABNORMAL LOW (ref 150–440)
RED BLOOD CELL COUNT: 4.9 10*12/L (ref 4.50–5.90)
RED CELL DISTRIBUTION WIDTH: 29.9 % — ABNORMAL HIGH (ref 12.0–15.0)
WBC ADJUSTED: 3.2 10*9/L — ABNORMAL LOW (ref 4.5–11.0)

## 2018-05-21 LAB — COMPREHENSIVE METABOLIC PANEL
ALKALINE PHOSPHATASE: 60 U/L (ref 38–126)
ALT (SGPT): 24 U/L (ref <50)
ANION GAP: 7 mmol/L (ref 7–15)
AST (SGOT): 40 U/L (ref 19–55)
BLOOD UREA NITROGEN: 11 mg/dL (ref 7–21)
BUN / CREAT RATIO: 12
CALCIUM: 9.6 mg/dL (ref 8.5–10.2)
CHLORIDE: 109 mmol/L — ABNORMAL HIGH (ref 98–107)
CO2: 28 mmol/L (ref 22.0–30.0)
CREATININE: 0.91 mg/dL (ref 0.70–1.30)
EGFR CKD-EPI AA MALE: >90 mL/min/{1.73_m2} (ref >=60)
EGFR CKD-EPI NON-AA MALE: >90 mL/min/{1.73_m2} (ref >=60)
POTASSIUM: 4.2 mmol/L (ref 3.5–5.0)
PROTEIN TOTAL: 7.5 g/dL (ref 6.5–8.3)
SODIUM: 144 mmol/L (ref 135–145)

## 2018-05-21 LAB — SLIDE REVIEW

## 2018-05-21 LAB — MAGNESIUM: Magnesium:MCnc:Pt:Ser/Plas:Qn:: 1.9

## 2018-05-21 LAB — BILIRUBIN TOTAL: Bilirubin:MCnc:Pt:Ser/Plas:Qn:: 1.4 — ABNORMAL HIGH

## 2018-05-21 LAB — SMEAR REVIEW

## 2018-05-21 LAB — MICROCYTES

## 2018-05-21 NOTE — Unmapped (Signed)
VS taken. Labs drawn via [place].    Port flushed and brisk blood return     Premeds and IV fluids given.    Pt received Oxaliplatin - no adverse events noted.     AVS given.    Pt stable and ambulated independently from clinic.

## 2018-05-21 NOTE — Unmapped (Signed)
Disability paperwork completed and faxed in to [employer] per pt request.

## 2018-05-22 NOTE — Unmapped (Signed)
Returned call and confirmed that the patient will need to be out of work for at least 3 - 6 months because of treatment and resulting anemia.

## 2018-05-22 NOTE — Unmapped (Signed)
Hi,     [place] [employer] contacted the Communication Center requesting to speak with the care team of Mark Calhoun to discuss:    Dr. Forbes Cellar filled out leave paperwork for this patient. Her recommendations are that the patient remain out of work for 3 - 6 months. However, she checked off a box on the form stating that the patient has no physical or psychological impairment.     Please contact us [employer] at [phone].    Program: GI  Speciality: Medical Oncology    Check Indicates criteria has been reviewed and confirmed with the patient:    []  Preferred Name   [x]  DOB and/or MR#  [x]  Preferred Contact Method  [x]  Phone Number(s)   []  MyChart     Thank you,   Kelli Hope  [facility]   [phone]

## 2018-06-08 NOTE — Unmapped (Signed)
[facility] Refill Coordination Note  Specialty Medication(s): CAPECITABINE 500MG       Mark Calhoun, DOB: 22-Jul-1965  Phone: [phone] (home) [phone] (work), Alternate phone contact: N/A  Phone or address changes today?: No  All above HIPAA information was verified with patient.  Shipping Address: [address]  [place] [address]   Insurance changes? No    Completed refill call assessment today to schedule patient's medication shipment from the [facility] [phone]).      Confirmed the medication and dosage are correct and have not changed: Yes, regimen is correct and unchanged.    Confirmed patient started or stopped the following medications in the past month:  No, there are no changes reported at this time.    Are you tolerating your medication?:  Mark Calhoun reports tolerating the medication.    ADHERENCE    (Below is required for Medicare Part B or Transplant patients only - per drug):   How many tablets were dispensed last month: 112  Patient currently has 0 TABLETS remaining. PT TO TAKE NEXT DOSE ON 12/12 NIGHT, AS HAS INFUSION THAT DAY.    Did you miss any doses in the past 4 weeks? No missed doses reported.    FINANCIAL/SHIPPING    Delivery Scheduled: Yes, Expected medication delivery date: 12/11     Medication will be delivered via UPS to the home address in [facility].    The patient will receive a drug information handout for each medication shipped and additional FDA Medication Guides as required.      Mark Calhoun did not have any additional questions at this time.    We will follow up with patient monthly for standard refill processing and delivery.      Thank you,  Westley Gambles   [facility] Specialty Technician

## 2018-06-09 MED FILL — CAPECITABINE 500 MG TABLET: 21 days supply | Qty: 112 | Fill #3 | Status: AC

## 2018-06-09 MED FILL — CAPECITABINE 500 MG TABLET: ORAL | 21 days supply | Qty: 112 | Fill #3

## 2018-06-11 ENCOUNTER — Non-Acute Institutional Stay: Admit: 2018-06-11 | Discharge: 2018-06-11 | Payer: PRIVATE HEALTH INSURANCE

## 2018-06-11 ENCOUNTER — Ambulatory Visit: Admit: 2018-06-11 | Discharge: 2018-06-11 | Payer: PRIVATE HEALTH INSURANCE

## 2018-06-11 DIAGNOSIS — C187 Malignant neoplasm of sigmoid colon: Principal | ICD-10-CM

## 2018-06-11 LAB — CBC W/ AUTO DIFF
BASOPHILS ABSOLUTE COUNT: 0 10*9/L (ref 0.0–0.1)
BASOPHILS RELATIVE PERCENT: 1.1 %
EOSINOPHILS ABSOLUTE COUNT: 0.1 10*9/L (ref 0.0–0.4)
EOSINOPHILS RELATIVE PERCENT: 2.9 %
HEMATOCRIT: 41.2 % (ref 41.0–53.0)
HEMOGLOBIN: 13.5 g/dL (ref 13.5–17.5)
LARGE UNSTAINED CELLS: 3 % (ref 0–4)
LYMPHOCYTES ABSOLUTE COUNT: 1.1 10*9/L — ABNORMAL LOW (ref 1.5–5.0)
MEAN CORPUSCULAR HEMOGLOBIN: 28.6 pg (ref 26.0–34.0)
MEAN CORPUSCULAR VOLUME: 87.7 fL (ref 80.0–100.0)
MEAN PLATELET VOLUME: 7.1 fL (ref 7.0–10.0)
MONOCYTES ABSOLUTE COUNT: 0.3 10*9/L (ref 0.2–0.8)
MONOCYTES RELATIVE PERCENT: 7.8 %
NEUTROPHILS ABSOLUTE COUNT: 1.5 10*9/L — ABNORMAL LOW (ref 2.0–7.5)
NEUTROPHILS RELATIVE PERCENT: 48.9 %
PLATELET COUNT: 101 10*9/L — ABNORMAL LOW (ref 150–440)
RED BLOOD CELL COUNT: 4.7 10*12/L (ref 4.50–5.90)
RED CELL DISTRIBUTION WIDTH: 27.6 % — ABNORMAL HIGH (ref 12.0–15.0)
WBC ADJUSTED: 3.2 10*9/L — ABNORMAL LOW (ref 4.5–11.0)

## 2018-06-11 LAB — EGFR CKD-EPI NON-AA MALE: Lab: >90

## 2018-06-11 LAB — ANISOCYTOSIS

## 2018-06-11 LAB — MAGNESIUM: Magnesium:MCnc:Pt:Ser/Plas:Qn:: 1.8

## 2018-06-11 LAB — SMEAR REVIEW

## 2018-06-11 LAB — CREATININE: EGFR CKD-EPI NON-AA MALE: >90 mL/min/{1.73_m2} (ref >=60)

## 2018-06-11 LAB — POTASSIUM: Potassium:SCnc:Pt:Ser/Plas:Qn:: 4.2

## 2018-06-11 NOTE — Unmapped (Signed)
VS taken. Labs drawn newly placed PIV.      22 gauge PIV flushed and brisk blood return noted.    Premeds and IV fluids given.    Pt received Oxaliplatin - no adverse events noted.  F/u Oncologist appt made. Last treatment in plan therefore no appt made for chemotherapy.  AVS given.    Pt stable and ambulated independently from clinic.  Mellody Life, RN.

## 2018-06-26 NOTE — Unmapped (Signed)
[facility] Refill Coordination Note    Specialty Medication(s) to be Shipped:   Hematology/Oncology: Capecitabine 500mg        Mark Calhoun, DOB: 1965-12-05  Phone: [phone] (home) [phone] (work)      All above HIPAA information was verified with patient.     Completed refill call assessment today to schedule patient's medication shipment from the [facility] [phone]).       Specialty medication(s) and dose(s) confirmed: Regimen is correct and unchanged.   Changes to medications: Truxton reports no changes reported at this time.  Changes to insurance: No  Questions for the pharmacist: No    The patient will receive a drug information handout for each medication shipped and additional FDA Medication Guides as required.      DISEASE/MEDICATION-SPECIFIC INFORMATION        N/A    ADHERENCE     Medication Adherence    Patient reported X missed doses in the last month:  0  Specialty Medication:  capecitabine 500 MG  Patient is on additional specialty medications:  No  Patient is on more than two specialty medications:  No  Any gaps in refill history greater than 2 weeks in the last 3 months:  no  Demonstrates understanding of importance of adherence:  yes  Informant:  patient  Reliability of informant:  reliable              Confirmed plan for next specialty medication refill:  delivery by pharmacy          Refill Coordination    Has the Patients' Contact Information Changed:  No  Is the Shipping Address Different:  No         MEDICARE PART B DOCUMENTATION     Capecitabine 500mg : Patient has 0 (on week off) tablets on hand.    SHIPPING     Shipping address confirmed in Epic.     Delivery Scheduled: Yes, Expected medication delivery date: 06/30/2018 via UPS or courier.     Medication will be delivered via UPS to the home address in [facility].    Jorje Guild   [facility] Specialty Technician

## 2018-06-29 MED ORDER — CAPECITABINE 500 MG TABLET
ORAL_TABLET | Freq: Two times a day (BID) | ORAL | 3 refills | 0.00000 days | Status: CP
Start: 2018-06-29 — End: 2018-11-06
  Filled 2018-06-29: qty 112, 21d supply, fill #0

## 2018-06-29 MED FILL — CAPECITABINE 500 MG TABLET: 21 days supply | Qty: 112 | Fill #0 | Status: AC

## 2018-06-29 NOTE — Unmapped (Signed)
Please refill if appropriate

## 2018-07-03 ENCOUNTER — Ambulatory Visit
Admit: 2018-07-03 | Discharge: 2018-07-04 | Payer: PRIVATE HEALTH INSURANCE | Attending: Hematology & Oncology | Primary: Hematology & Oncology

## 2018-07-03 ENCOUNTER — Encounter: Admit: 2018-07-03 | Discharge: 2018-07-04 | Payer: PRIVATE HEALTH INSURANCE

## 2018-07-03 DIAGNOSIS — C187 Malignant neoplasm of sigmoid colon: Principal | ICD-10-CM

## 2018-07-03 LAB — COMPREHENSIVE METABOLIC PANEL
ALBUMIN: 4.1 g/dL (ref 3.5–5.0)
ALKALINE PHOSPHATASE: 84 U/L (ref 38–126)
ALT (SGPT): 23 U/L (ref <50)
ANION GAP: 8 mmol/L (ref 7–15)
AST (SGOT): 39 U/L (ref 19–55)
BILIRUBIN TOTAL: 1.8 mg/dL — ABNORMAL HIGH (ref 0.0–1.2)
BLOOD UREA NITROGEN: 8 mg/dL (ref 7–21)
BUN / CREAT RATIO: 10
CHLORIDE: 106 mmol/L (ref 98–107)
CO2: 26 mmol/L (ref 22.0–30.0)
CREATININE: 0.78 mg/dL (ref 0.70–1.30)
EGFR CKD-EPI AA MALE: >90 mL/min/{1.73_m2} (ref >=60)
EGFR CKD-EPI NON-AA MALE: >90 mL/min/{1.73_m2} (ref >=60)
GLUCOSE RANDOM: 162 mg/dL (ref 70–179)
POTASSIUM: 4.2 mmol/L (ref 3.5–5.0)
PROTEIN TOTAL: 7.9 g/dL (ref 6.5–8.3)
SODIUM: 140 mmol/L (ref 135–145)

## 2018-07-03 LAB — CBC W/ AUTO DIFF
BASOPHILS ABSOLUTE COUNT: 0 10*9/L (ref 0.0–0.1)
BASOPHILS RELATIVE PERCENT: 0.6 %
EOSINOPHILS ABSOLUTE COUNT: 0.1 10*9/L (ref 0.0–0.4)
EOSINOPHILS RELATIVE PERCENT: 2.6 %
HEMATOCRIT: 43.1 % (ref 41.0–53.0)
HEMOGLOBIN: 13.9 g/dL (ref 13.5–17.5)
LARGE UNSTAINED CELLS: 2 % (ref 0–4)
LYMPHOCYTES ABSOLUTE COUNT: 0.9 10*9/L — ABNORMAL LOW (ref 1.5–5.0)
LYMPHOCYTES RELATIVE PERCENT: 28.2 %
MEAN CORPUSCULAR HEMOGLOBIN: 30.6 pg (ref 26.0–34.0)
MEAN CORPUSCULAR VOLUME: 94.6 fL (ref 80.0–100.0)
MEAN PLATELET VOLUME: 8.3 fL (ref 7.0–10.0)
MONOCYTES ABSOLUTE COUNT: 0.4 10*9/L (ref 0.2–0.8)
MONOCYTES RELATIVE PERCENT: 13.1 %
NEUTROPHILS ABSOLUTE COUNT: 1.8 10*9/L — ABNORMAL LOW (ref 2.0–7.5)
NEUTROPHILS RELATIVE PERCENT: 53.1 %
PLATELET COUNT: 101 10*9/L — ABNORMAL LOW (ref 150–440)
RED BLOOD CELL COUNT: 4.56 10*12/L (ref 4.50–5.90)

## 2018-07-03 LAB — CHLORIDE: Chloride:SCnc:Pt:Ser/Plas:Qn:: 106

## 2018-07-03 LAB — EOSINOPHILS ABSOLUTE COUNT: Lab: 0.1

## 2018-07-03 LAB — SMEAR REVIEW: Lab: 0

## 2018-07-03 LAB — BILIRUBIN DIRECT: Bilirubin.glucuronidated:MCnc:Pt:Ser/Plas:Qn:: 0.2

## 2018-07-03 NOTE — Unmapped (Signed)
Labs drawn and sent for analysis.  Care provided by Blythe Stanford, RN

## 2018-07-03 NOTE — Unmapped (Signed)
1145 Pt received to chair 52. Pt has a PIV already in place. Pt was given premeds. Pt is here for Oxali.   1510 pt completed treatment and tolerated well. PIV was flushed and removed. Pt was discharged from clinic.

## 2018-07-03 NOTE — Unmapped (Signed)
[facility]     PRIMARY CARE PROVIDER:  Lynnell Jude, MD  [address]  [place] [address]    CONSULTING PROVIDERS  Dr. Nemiah Commander, [facility]   ____________________________________________________________________    CANCER HISTORY  Diagnosis 03/2018 stage III colon cancer presenting with severe anemia   03/17/18 lap left/sigmoid colectomy: pT3N1b (3/42) moderately differentiated adenocarcinoma, discontig tumor nodules present, intermediate tumor budding,  IHC intact for MMR enzymes (MLH1, MSH2, MSH6, PMS2). preop CEA 4.2   04/09/18: CapeOX (planned 3 month duration)  ____________________________________________________________________    ASSESSMENT  1. Stage IIIB MSS sigmoid colon cancer  2. Indeterminate liver hypodensities, likely benign  3,  Iron deficiency anemia-- resolving  4. Gout, imrpvoed  5. ECOG PS 0      RECOMMENDATIONS  1. Adjuvant chemotherapy: continue capeox. low threshold for dropping oxaliplatin with any progressive neuropathy  2. Liver MRI + cscope at completion of adjuvant therapy  3. rtc in 3 weeks for cycle 6; 6 weeks for cycle 7 and visit and decision about oxali    DISCUSSION  We reviewed the conundrum again abotu whther to push on with 3 additional months of therapy in the setting of the discontiguous tumor nodules. These are concerning as they do seem to carry some independent risk fro recurrence above and beyond nodes, and this makes me feel that extrapolating regarding the low risk 3 month data is perhaps not quite appropriate. And while capeox was not non-inferior at 3 months in the high risk, the absolute difference was basically 0 in the risk of DFS. In the overall high risk group, which Fayrene Fearing doesn't fit into but..., the absolute difference is still only a couple of %. So.... at the most it looks like an additional 3 months of therapy gets him a couple % increase in DFS. I dont know how much of that can be achieved with 5FU alone, but probably the bulk of it given that overall the additional of oxaliplatin adds only a few %.     So our options are   - stop treatment  - continue capeox, dropping oxali at sign of progrsesion of neuropathy   - cape only to finish out.     After discussion of pros and cons he feels the cape is hardest to tolerate and escalating to single agent cape doses might be hardest. So, we will move forward with capeox. Probably wont treat past cycle 7 with oxali though  ______________________________________________________________________  HISTORY     Mark Calhoun is seen today at the [facility] for consultation regarding colon cancer    Last cycle with a number of effects  No nausea  Appetite is maybe down a bit, weight down today bceause didn't eat for 3 days with bad gout flar  But before that weight was stable.   + fatigue, worse from ~day 5 through cape. Week off is good. He isnt doing a whole lot. Tried to walk but maybe ppted the gout  No diarrhea  No hand foot  He has some mild tingling/ funny sensation in his fingers but barely. Cold sensitivitiy is less than it was in first cycle. No tinglign in his feet at all.     Knee is quite bad. Past few days could barely get out of bed because of the gout. Improving now that he has had colchine and advil on board for a few days. But still limping    MEDICAL HISTORY  Interval: another gout flare with this last chemo, again forgot a colchicine dose.   Prior:   Gout-- has only had a few flares over the years.     No Known Allergies  Medications reviewed in the EMR    SOCIAL HISTORY  Current: comes with his wife and brother. He is still out of work. Getting bored.   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 His brother [name] is my partner in medical oncology. Dong is married and has two teenage daughters (17 and 12 as of 03/2018).     REVIEW OF SYSTEMS  The remainder of a comprehensive 10 systems review is negative.    PHYSICAL EXAM  BP 130/86  - Pulse 63  - Temp 36.6 ??C (97.9 ??F) (Oral)  - Resp 16  - Wt 86.1 kg (189 lb 14.4 oz)  - SpO2 98%  - BMI 26.50 kg/m??    GENERAL: well developed, well nourished, in no distress  PSYCH: full and appropriate range of affect with good insight and judgement  HEENT: NCAT, sclerae anicteric,   EXT: right knee is demonstrably bigger than left through pants, limping  SKIN: no HFS    OBJECTIVE DATA  hgb good  ANC holding low but steady  Bili up but transaminases normal. Alk normal.     RADIOLOGY RESULTS (scans are personally reviewed by me)  none

## 2018-07-21 NOTE — Unmapped (Signed)
[facility] Refill Coordination Note  Specialty Medication(s): Caectabine 500mg   Additional Medications shipped: none    Brandi Armato, DOB: Oct 28, 1965  Phone: [phone] (home) [phone] (work), Alternate phone contact: N/A  Phone or address changes today?: No  All above HIPAA information was verified with patient.  Shipping Address: [address]  [place] [address]   Insurance changes? No    Completed refill call assessment today to schedule patient's medication shipment from the [facility] [phone]).      Confirmed the medication and dosage are correct and have not changed: Yes, regimen is correct and unchanged.    Confirmed patient started or stopped the following medications in the past month:  No, there are no changes reported at this time.    Are you tolerating your medication?:  Joshuwa reports tolerating the medication.    ADHERENCE    (Below is required for Medicare Part B or Transplant patients only - per drug):   How many tablets were dispensed last month: 112  Patient currently has 0 remaining.    Did you miss any doses in the past 4 weeks? No missed doses reported.    FINANCIAL/SHIPPING    Delivery Scheduled: Yes, Expected medication delivery date: 07/23/18     Medication will be delivered via UPS to the home address in [facility].    The patient will receive a drug information handout for each medication shipped and additional FDA Medication Guides as required.      Arling did not have any additional questions at this time.    We will follow up with patient monthly for standard refill processing and delivery.      Thank you,  Tera Helper   [facility] Specialty Pharmacist

## 2018-07-23 MED FILL — CAPECITABINE 500 MG TABLET: 21 days supply | Qty: 112 | Fill #1 | Status: AC

## 2018-07-23 MED FILL — CAPECITABINE 500 MG TABLET: ORAL | 21 days supply | Qty: 112 | Fill #1

## 2018-07-24 ENCOUNTER — Encounter: Admit: 2018-07-24 | Discharge: 2018-07-24 | Payer: PRIVATE HEALTH INSURANCE

## 2018-07-24 DIAGNOSIS — C187 Malignant neoplasm of sigmoid colon: Principal | ICD-10-CM

## 2018-07-24 LAB — CBC W/ AUTO DIFF
BASOPHILS ABSOLUTE COUNT: 0 10*9/L (ref 0.0–0.1)
EOSINOPHILS ABSOLUTE COUNT: 0.1 10*9/L (ref 0.0–0.4)
EOSINOPHILS RELATIVE PERCENT: 4.9 %
HEMATOCRIT: 41.3 % (ref 41.0–53.0)
LARGE UNSTAINED CELLS: 5 % — ABNORMAL HIGH (ref 0–4)
LYMPHOCYTES ABSOLUTE COUNT: 0.6 10*9/L — ABNORMAL LOW (ref 1.5–5.0)
LYMPHOCYTES RELATIVE PERCENT: 24.5 %
MEAN CORPUSCULAR HEMOGLOBIN CONC: 32.5 g/dL (ref 31.0–37.0)
MEAN CORPUSCULAR HEMOGLOBIN: 31.7 pg (ref 26.0–34.0)
MEAN CORPUSCULAR VOLUME: 97.6 fL (ref 80.0–100.0)
MEAN PLATELET VOLUME: 7.8 fL (ref 7.0–10.0)
MONOCYTES ABSOLUTE COUNT: 0.3 10*9/L (ref 0.2–0.8)
MONOCYTES RELATIVE PERCENT: 11 %
NEUTROPHILS ABSOLUTE COUNT: 1.3 10*9/L — ABNORMAL LOW (ref 2.0–7.5)
NEUTROPHILS RELATIVE PERCENT: 53.6 %
PLATELET COUNT: 89 10*9/L — ABNORMAL LOW (ref 150–440)
RED BLOOD CELL COUNT: 4.23 10*12/L — ABNORMAL LOW (ref 4.50–5.90)
RED CELL DISTRIBUTION WIDTH: 21.9 % — ABNORMAL HIGH (ref 12.0–15.0)
WBC ADJUSTED: 2.4 10*9/L — ABNORMAL LOW (ref 4.5–11.0)

## 2018-07-24 LAB — MEAN CORPUSCULAR HEMOGLOBIN: Lab: 31.7

## 2018-07-24 LAB — EGFR CKD-EPI NON-AA MALE: Lab: >90

## 2018-07-24 LAB — MAGNESIUM: Magnesium:MCnc:Pt:Ser/Plas:Qn:: 1.9

## 2018-07-24 LAB — POTASSIUM: Potassium:SCnc:Pt:Ser/Plas:Qn:: 4.3

## 2018-07-24 NOTE — Unmapped (Signed)
Labs found to be within parameters for treatment today. Request for drug sent to pharmacy.

## 2018-07-25 NOTE — Unmapped (Signed)
VS taken. Labs drawn via newly place PIV.    22 gauge PIV flushed and brisk blood return noted.     Premeds and IV fluids given.    Pt received Oxaliplatin - no adverse events noted.     AVS refused.    Pt stable and ambulated independently from clinic.  VWilliams,RN.

## 2018-08-11 MED ORDER — COLCHICINE 0.6 MG CAPSULE
ORAL_CAPSULE | 2 refills | 0 days | Status: CP
Start: 2018-08-11 — End: ?

## 2018-08-11 NOTE — Unmapped (Signed)
[facility] Refill Coordination Note  Specialty Medication(s): Capecitabine 500mg   Additional Medications shipped: N/A    Mark Calhoun, DOB: 1965/07/10  Phone: [phone] (home) [phone] (work), Alternate phone contact: N/A  Phone or address changes today?: No  All above HIPAA information was verified with patient.  Shipping Address: [address]  [place] [address]   Insurance changes? No    Completed refill call assessment today to schedule patient's medication shipment from the [facility] [phone]).      Confirmed the medication and dosage are correct and have not changed: Yes, regimen is correct and unchanged.    Confirmed patient started or stopped the following medications in the past month:  No, there are no changes reported at this time.    Are you tolerating your medication?:  Mark Calhoun reports tolerating the medication.    ADHERENCE    (Below is required for Medicare Part B or Transplant patients only - per drug):   How many tablets were dispensed last month: 112  Patient currently has 0 remaining.    Did you miss any doses in the past 4 weeks? No missed doses reported.    FINANCIAL/SHIPPING    Delivery Scheduled: Yes, Expected medication delivery date: 08/13/18     Medication will be delivered via UPS to the home address in [facility].    The patient will receive a drug information handout for each medication shipped and additional FDA Medication Guides as required.      Mark Calhoun did not have any additional questions at this time.    We will follow up with patient monthly for standard refill processing and delivery.      Thank you,  Jasper Loser   [facility] Specialty Technician

## 2018-08-12 MED FILL — CAPECITABINE 500 MG TABLET: 21 days supply | Qty: 112 | Fill #2 | Status: AC

## 2018-08-12 MED FILL — CAPECITABINE 500 MG TABLET: ORAL | 21 days supply | Qty: 112 | Fill #2

## 2018-08-14 ENCOUNTER — Encounter
Admit: 2018-08-14 | Discharge: 2018-08-14 | Payer: PRIVATE HEALTH INSURANCE | Attending: Hematology & Oncology | Primary: Hematology & Oncology

## 2018-08-14 ENCOUNTER — Encounter: Admit: 2018-08-14 | Discharge: 2018-08-14 | Payer: PRIVATE HEALTH INSURANCE

## 2018-08-14 ENCOUNTER — Other Ambulatory Visit: Admit: 2018-08-14 | Discharge: 2018-08-14 | Payer: PRIVATE HEALTH INSURANCE

## 2018-08-14 DIAGNOSIS — C187 Malignant neoplasm of sigmoid colon: Principal | ICD-10-CM

## 2018-08-14 LAB — COMPREHENSIVE METABOLIC PANEL
ALBUMIN: 4 g/dL (ref 3.5–5.0)
ALKALINE PHOSPHATASE: 101 U/L (ref 38–126)
ALT (SGPT): 26 U/L (ref <50)
ANION GAP: 9 mmol/L (ref 7–15)
AST (SGOT): 49 U/L (ref 19–55)
BILIRUBIN TOTAL: 1.8 mg/dL — ABNORMAL HIGH (ref 0.0–1.2)
BLOOD UREA NITROGEN: 7 mg/dL (ref 7–21)
BUN / CREAT RATIO: 10
CALCIUM: 9.4 mg/dL (ref 8.5–10.2)
CHLORIDE: 106 mmol/L (ref 98–107)
CO2: 24 mmol/L (ref 22.0–30.0)
CREATININE: 0.69 mg/dL — ABNORMAL LOW (ref 0.70–1.30)
EGFR CKD-EPI AA MALE: >90 mL/min/{1.73_m2} (ref >=60)
EGFR CKD-EPI NON-AA MALE: >90 mL/min/{1.73_m2} (ref >=60)
GLUCOSE RANDOM: 180 mg/dL — ABNORMAL HIGH (ref 70–179)
POTASSIUM: 4.1 mmol/L (ref 3.5–5.0)
PROTEIN TOTAL: 7.8 g/dL (ref 6.5–8.3)
SODIUM: 139 mmol/L (ref 135–145)

## 2018-08-14 LAB — CBC W/ AUTO DIFF
BASOPHILS ABSOLUTE COUNT: 0 10*9/L (ref 0.0–0.1)
BASOPHILS RELATIVE PERCENT: 0.8 %
EOSINOPHILS ABSOLUTE COUNT: 0.1 10*9/L (ref 0.0–0.4)
EOSINOPHILS RELATIVE PERCENT: 4.4 %
HEMATOCRIT: 42.4 % (ref 41.0–53.0)
HEMOGLOBIN: 13.9 g/dL (ref 13.5–17.5)
LARGE UNSTAINED CELLS: 3 % (ref 0–4)
LYMPHOCYTES ABSOLUTE COUNT: 0.7 10*9/L — ABNORMAL LOW (ref 1.5–5.0)
LYMPHOCYTES RELATIVE PERCENT: 33.6 %
MEAN CORPUSCULAR HEMOGLOBIN CONC: 32.8 g/dL (ref 31.0–37.0)
MEAN CORPUSCULAR HEMOGLOBIN: 33.3 pg (ref 26.0–34.0)
MEAN CORPUSCULAR VOLUME: 101.4 fL — ABNORMAL HIGH (ref 80.0–100.0)
MEAN PLATELET VOLUME: 7.8 fL (ref 7.0–10.0)
MONOCYTES ABSOLUTE COUNT: 0.3 10*9/L (ref 0.2–0.8)
MONOCYTES RELATIVE PERCENT: 13.9 %
NEUTROPHILS ABSOLUTE COUNT: 0.9 10*9/L — ABNORMAL LOW (ref 2.0–7.5)
NEUTROPHILS RELATIVE PERCENT: 44.4 %
PLATELET COUNT: 57 10*9/L — ABNORMAL LOW (ref 150–440)
WBC ADJUSTED: 2.1 10*9/L — ABNORMAL LOW (ref 4.5–11.0)

## 2018-08-14 LAB — SMEAR REVIEW

## 2018-08-14 LAB — MAGNESIUM: Magnesium:MCnc:Pt:Ser/Plas:Qn:: 1.8

## 2018-08-14 LAB — MEAN CORPUSCULAR HEMOGLOBIN: Lab: 33.3

## 2018-08-14 LAB — CREATININE: Creatinine:MCnc:Pt:Ser/Plas:Qn:: 0.69 — ABNORMAL LOW

## 2018-08-14 NOTE — Unmapped (Signed)
[facility]     PRIMARY CARE PROVIDER:  Lynnell Jude, MD  [address]  [place] [address]    CONSULTING PROVIDERS  Dr. Nemiah Commander, [facility]   ____________________________________________________________________    CANCER HISTORY  Diagnosis 03/2018 stage III colon cancer presenting with severe anemia   03/17/18 lap left/sigmoid colectomy: pT3N1b (3/42) moderately differentiated adenocarcinoma, discontig tumor nodules present, intermediate tumor budding,  IHC intact for MMR enzymes (MLH1, MSH2, MSH6, PMS2). preop CEA 4.2   04/09/18: CapeOX     ____________________________________________________________________    ASSESSMENT  1. Stage IIIB MSS sigmoid colon cancer  2. Indeterminate liver hypodensities, likely benign  3, Gout, imrpvoed  5. ECOG PS 0      RECOMMENDATIONS  1. Adjuvant chemotherapy: continue capeox for cycle 7, cycle 8 cape only   2. Liver MRI + cscope at completion of adjuvant therapy-- back here in 3 months    DISCUSSION  Is having some mounting tox with acute oxali neuopathy lasting longer. Will plan to complete this 7th cycle with capeox then cycle 8 with cape only. Need to do completion cscope once adjuvant therapy is done, and then liver MRI to be sure liver is cystic. Will do at 3 month post treatemtn  ______________________________________________________________________  HISTORY     Mark Calhoun is seen today at the [facility] for consultation regarding colon cancer    Doing well but fatigue is definitely mounting. Doesn't feel like doing much- feels a bit brain dead. Sits around watching TV a lot. He is bored.     No gout flares this go-around  He doesn't have any real residual neuropathy but he did have more /longer acute dysesthesias in his arm after infusion.   No real nausae/vomiting  Bowels ok--did have some BRBPR a few days then some diarrhea for a few days. Its resolved.     MEDICAL HISTORY  Interval: none  Prior:   Gout-- has only had a few flares over the years.     No Known Allergies  Medications reviewed in the EMR    SOCIAL HISTORY   Current: stil out of work because of treatment. 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 Tu is married and has two teenage daughters (17 and 45 as of 03/2018).     REVIEW OF SYSTEMS  The remainder of a comprehensive 10 systems review is negative.    PHYSICAL EXAM  BP 144/88  - Pulse 80  - Temp 36.8 ??C (98.2 ??F) (Oral)  - Resp 16  - Wt 88.9 kg (195 lb 14.4 oz)  - SpO2 98%  - BMI 27.33 kg/m??    GENERAL: well developed, well nourished, in no distress  PSYCH: full and appropriate range of affect with good insight and judgement  HEENT: NCAT, sclerae anicteric,   EXT: gait normal  SKIN: no HFS    OBJECTIVE DATA  Still isolated hyperbili.   Cbc good    RADIOLOGY RESULTS (scans are personally reviewed by me)  none

## 2018-08-14 NOTE — Unmapped (Signed)
Pt arrived to infusion. Pt not within parameters for treatment today but OK to treat ordered entered by NP per Dr. Forbes Cellar. IV flushed with blood return noted. Premedications given at 1142. NS started.   1441Oxali completed and tolerated well. IV flushed, blood return present. Removed intact. Pt declined AVS discharged with no further needs.

## 2018-08-14 NOTE — Unmapped (Signed)
Labs collected and sent. Care provided by D. Musician.

## 2018-08-14 NOTE — Unmapped (Signed)
Labs found to be within parameters for treatment today. Request for drug sent to pharmacy.

## 2018-09-01 DIAGNOSIS — D126 Benign neoplasm of colon, unspecified: Principal | ICD-10-CM

## 2018-09-01 NOTE — Unmapped (Signed)
[facility] Refill Coordination Note    Specialty Medication(s) to be Shipped:   Hematology/Oncology: Capecitabine 500mg     Other medication(s) to be shipped: N/A     Mark Calhoun, DOB: Nov 07, 1965  Phone: [phone] (home) [phone] (work)      All above HIPAA information was verified with patient.     Completed refill call assessment today to schedule patient's medication shipment from the [facility] [phone]).       Specialty medication(s) and dose(s) confirmed: Regimen is correct and unchanged.   Changes to medications: Mark Calhoun reports no changes reported at this time.  Changes to insurance: No  Questions for the pharmacist: No    Confirmed patient received Welcome Packet with first shipment. The patient will receive a drug information handout for each medication shipped and additional FDA Medication Guides as required.       DISEASE/MEDICATION-SPECIFIC INFORMATION        N/A    SPECIALTY MEDICATION ADHERENCE     Medication Adherence    Patient reported X missed doses in the last month:  0  Specialty Medication:  Capecitabine 500mg   Patient is on additional specialty medications:  No                Capecitabine 500 mg: 0 days of medicine on hand       SHIPPING     Shipping address confirmed in Epic.     Delivery Scheduled: Yes, Expected medication delivery date: 09/03/18.     Medication will be delivered via UPS to the home address in Epic [facility].    Mark Calhoun   [facility] Specialty Technician

## 2018-09-02 MED FILL — CAPECITABINE 500 MG TABLET: 21 days supply | Qty: 112 | Fill #3 | Status: AC

## 2018-09-02 MED FILL — CAPECITABINE 500 MG TABLET: ORAL | 21 days supply | Qty: 112 | Fill #3

## 2018-09-07 ENCOUNTER — Encounter: Admit: 2018-09-07 | Discharge: 2018-09-08 | Payer: PRIVATE HEALTH INSURANCE

## 2018-09-07 DIAGNOSIS — C187 Malignant neoplasm of sigmoid colon: Principal | ICD-10-CM

## 2018-09-07 LAB — CBC
HEMATOCRIT: 40.4 % — ABNORMAL LOW (ref 41.0–53.0)
HEMATOCRIT: 40.4 % — ABNORMAL LOW (ref 41.0–53.0)
HEMOGLOBIN: 13.7 g/dL (ref 13.5–17.5)
MEAN CORPUSCULAR HEMOGLOBIN: 33.9 pg (ref 26.0–34.0)
MEAN CORPUSCULAR VOLUME: 99.7 fL (ref 80.0–100.0)
MEAN PLATELET VOLUME: 8.1 fL (ref 7.0–10.0)
PLATELET COUNT: 66 10*9/L — ABNORMAL LOW (ref 150–440)
RED BLOOD CELL COUNT: 4.05 10*12/L — ABNORMAL LOW (ref 4.50–5.90)
WBC ADJUSTED: 2.8 10*9/L — ABNORMAL LOW (ref 4.5–11.0)

## 2018-09-07 NOTE — Unmapped (Addendum)
LVM to call back if still an issue with the nose bleed.    Patient called back about issues. He had a nose bleed this morning that he was able to get under control, but it seemed to happen without provocation and it made him worried about his platelet level that was 57 on 2/14.    Patient is also having vertigo since yesterday morning. Started when he sat up, and as long as he sat still for a little bit it went away. Comes back if he turns or tilts or nods his head too quickly. Room spins, makes him mildly nauseous but has not felt the need to take zofran yet.    Encouraged patient not to drive self until vertigo episodes resolve. Also encouraged him to come in to hospital with any bleeding he can't get under control or losing ability to concentrate r/t vertigo.    Plan: reach out to Dr. Forbes Cellar and NN.

## 2018-09-07 NOTE — Unmapped (Signed)
Hi,     Mark Calhoun has contacted the Communication Center in regards to the following symptom:     Bleeding: nose    Please contact patient at [phone]    Check Indicates criteria has been reviewed and confirmed with the patient:    []  Preferred Name   [x]  DOB and/or Mark#  [x]  Preferred Contact Method  [x]  Phone Number(s)   []  MyChart     A page or telephone call has been made to the corresponding clinic.     Thank you,  Christell Faith   [facility]   [phone]

## 2018-09-08 NOTE — Unmapped (Signed)
Pt reached out via MyChart. Called pt to follow up. Pt reports intermittent vertigo. Feels as if room is spinning when he lies flat on bed and turns head to the right or if he bends over. Pt drinking lots of water. Voiding clear yellow urine. Tolerating po intake without difficulty. Denies fever. Denies nausea/vomiting. Pt reports that he had rectal bleeding after last cycle but none at this time. Pt denies any other symptoms. States I just don't feel quite right.  Told pt I would follow up with him after speaking to the team. Pt verbalizes understanding.

## 2018-09-09 NOTE — Unmapped (Signed)
Reached out to pt to see if he wanted to schedule appt in clinic tomorrow (March 12th) to discuss symptoms. Encouraged him to call back to request appt  and left call back number.

## 2018-09-22 NOTE — Unmapped (Signed)
Patient disenrolled from [facility] specialty refill calls for capecitabine as patient is no longer receiving the medication from the [facility].    Care coordination: 5 minutes    Konrad Penta, PharmD, BCOP, CPP  Clinical Pharmacist Practitioner, Gastrointestinal Oncology  Pager: [phone]

## 2018-11-06 ENCOUNTER — Encounter
Admit: 2018-11-06 | Discharge: 2018-11-06 | Payer: PRIVATE HEALTH INSURANCE | Attending: Hematology & Oncology | Primary: Hematology & Oncology

## 2018-11-06 ENCOUNTER — Encounter: Admit: 2018-11-06 | Discharge: 2018-11-06 | Payer: PRIVATE HEALTH INSURANCE

## 2018-11-06 ENCOUNTER — Encounter: Admit: 2018-11-06 | Discharge: 2018-11-06 | Payer: BLUE CROSS/BLUE SHIELD

## 2018-11-06 DIAGNOSIS — C187 Malignant neoplasm of sigmoid colon: Principal | ICD-10-CM

## 2018-12-07 ENCOUNTER — Encounter: Admit: 2018-12-07 | Discharge: 2018-12-08 | Payer: BLUE CROSS/BLUE SHIELD | Attending: Family | Primary: Family

## 2018-12-07 DIAGNOSIS — Z1159 Encounter for screening for other viral diseases: Principal | ICD-10-CM

## 2018-12-09 ENCOUNTER — Encounter
Admit: 2018-12-09 | Discharge: 2018-12-09 | Payer: PRIVATE HEALTH INSURANCE | Attending: Anesthesiology | Primary: Anesthesiology

## 2018-12-09 ENCOUNTER — Encounter
Admit: 2018-12-09 | Discharge: 2018-12-09 | Payer: BLUE CROSS/BLUE SHIELD | Attending: Anesthesiology | Primary: Anesthesiology

## 2018-12-09 ENCOUNTER — Encounter: Admit: 2018-12-09 | Discharge: 2018-12-09 | Payer: PRIVATE HEALTH INSURANCE

## 2018-12-09 DIAGNOSIS — D126 Benign neoplasm of colon, unspecified: Principal | ICD-10-CM

## 2019-04-08 ENCOUNTER — Encounter
Admit: 2019-04-08 | Discharge: 2019-04-08 | Payer: PRIVATE HEALTH INSURANCE | Attending: Hematology & Oncology | Primary: Hematology & Oncology

## 2019-04-08 ENCOUNTER — Encounter: Admit: 2019-04-08 | Discharge: 2019-04-08 | Payer: PRIVATE HEALTH INSURANCE

## 2019-04-08 DIAGNOSIS — C187 Malignant neoplasm of sigmoid colon: Secondary | ICD-10-CM

## 2019-04-14 DIAGNOSIS — C187 Malignant neoplasm of sigmoid colon: Principal | ICD-10-CM

## 2019-04-15 ENCOUNTER — Encounter: Admit: 2019-04-15 | Discharge: 2019-04-16 | Payer: PRIVATE HEALTH INSURANCE

## 2019-04-19 DIAGNOSIS — C187 Malignant neoplasm of sigmoid colon: Principal | ICD-10-CM

## 2019-04-23 DIAGNOSIS — C187 Malignant neoplasm of sigmoid colon: Principal | ICD-10-CM

## 2019-04-27 ENCOUNTER — Encounter: Admit: 2019-04-27 | Discharge: 2019-04-27 | Payer: PRIVATE HEALTH INSURANCE

## 2019-04-27 ENCOUNTER — Ambulatory Visit: Admit: 2019-04-27 | Discharge: 2019-04-27 | Payer: PRIVATE HEALTH INSURANCE

## 2019-04-27 DIAGNOSIS — C187 Malignant neoplasm of sigmoid colon: Principal | ICD-10-CM

## 2019-05-05 MED ORDER — PROCHLORPERAZINE MALEATE 10 MG TABLET
ORAL_TABLET | Freq: Four times a day (QID) | ORAL | 2 refills | 8.00000 days | Status: CP | PRN
Start: 2019-05-05 — End: ?

## 2019-05-05 MED ORDER — ONDANSETRON HCL 8 MG TABLET
ORAL_TABLET | Freq: Three times a day (TID) | ORAL | 2 refills | 10.00000 days | Status: CP | PRN
Start: 2019-05-05 — End: ?

## 2019-05-05 MED ORDER — LOPERAMIDE 2 MG CAPSULE
ORAL_CAPSULE | 11 refills | 0 days | Status: CP
Start: 2019-05-05 — End: ?

## 2019-05-06 ENCOUNTER — Encounter
Admit: 2019-05-06 | Discharge: 2019-05-06 | Payer: PRIVATE HEALTH INSURANCE | Attending: Hematology & Oncology | Primary: Hematology & Oncology

## 2019-05-06 ENCOUNTER — Ambulatory Visit: Admit: 2019-05-06 | Discharge: 2019-05-06 | Payer: PRIVATE HEALTH INSURANCE

## 2019-05-06 ENCOUNTER — Institutional Professional Consult (permissible substitution): Admit: 2019-05-06 | Discharge: 2019-05-06 | Payer: PRIVATE HEALTH INSURANCE

## 2019-05-06 DIAGNOSIS — Z23 Encounter for immunization: Principal | ICD-10-CM

## 2019-05-06 DIAGNOSIS — C187 Malignant neoplasm of sigmoid colon: Principal | ICD-10-CM

## 2019-05-06 DIAGNOSIS — D696 Thrombocytopenia, unspecified: Principal | ICD-10-CM

## 2019-05-12 ENCOUNTER — Ambulatory Visit: Admit: 2019-05-12 | Discharge: 2019-05-13 | Payer: PRIVATE HEALTH INSURANCE | Attending: Surgery | Primary: Surgery

## 2019-05-20 ENCOUNTER — Encounter: Admit: 2019-05-20 | Discharge: 2019-05-20 | Payer: PRIVATE HEALTH INSURANCE

## 2019-05-20 DIAGNOSIS — D6959 Other secondary thrombocytopenia: Secondary | ICD-10-CM

## 2019-05-20 DIAGNOSIS — D701 Agranulocytosis secondary to cancer chemotherapy: Secondary | ICD-10-CM

## 2019-05-20 DIAGNOSIS — T451X5A Adverse effect of antineoplastic and immunosuppressive drugs, initial encounter: Principal | ICD-10-CM

## 2019-05-20 DIAGNOSIS — C187 Malignant neoplasm of sigmoid colon: Principal | ICD-10-CM

## 2019-05-20 DIAGNOSIS — Z09 Encounter for follow-up examination after completed treatment for conditions other than malignant neoplasm: Principal | ICD-10-CM

## 2019-05-20 MED ORDER — UDENYCA 6 MG/0.6 ML SUBCUTANEOUS SYRINGE
Freq: Once | SUBCUTANEOUS | 6 refills | 2 days | Status: CP
Start: 2019-05-20 — End: 2019-05-22
  Filled 2019-05-21: qty 1.2, 28d supply, fill #0

## 2019-05-21 MED FILL — UDENYCA 6 MG/0.6 ML SUBCUTANEOUS SYRINGE: 28 days supply | Qty: 1 | Fill #0 | Status: AC

## 2019-05-21 NOTE — Unmapped (Signed)
[facility]   Patient Onboarding/Medication Counseling    Mark Calhoun is a 53 y.o. male with Malignant neoplasm of sigmoid colon; Chemotherapy induced neutropenia who I am counseling today on continuation of therapy.  I am speaking to the patient.    Verified patient's date of birth / HIPAA.    Specialty medication(s) to be sent: Hematology/Oncology: Greggory Keen      Non-specialty medications/supplies to be sent:       Medications not needed at this time:          Udenyca (pegfilgrastim)    Medication & Administration     Dosage: Inject the contents of 1 syringe (6 mg total) under the skin once for 1 dose    Administration: Inject under the skin of the thigh, abdomen or upper arm. Rotate sites with each injection.  ? Injection instructions   o Take 1 syringe out of the refrigerator and allow to stand at room temperature for at least 30 minutes  o Wash hands and remove syringe from the tray  o Check the syringe for the following   ? Expiration date  ? Medication is clear and colorless and free from particles  ? It is normal to see 1 or more air bubbles in the syringe and removal of the air is not necessary  ? It appears unused or damaged and the needle cap is securely attached  o Choose your injection site (abdomen but not within 2 inches of navel, thigh or if someone else is injecting may also use upper arms or upper outer area of buttocks)  o Clean the injection site with an alcohol wipe using a circular motion and allow to air dry completely  o Pull the needle cap straight off and discard  o Hold the syringe like a dart (just under the finger grips) with your thumb and index finger.  o Pinch the skin and insert the needle at a 45-90 degree angle (keep skin pinched while injecting)  o Push the plunger head down to deliver dose using a slow and constant pressure until the plunger head reaches the bottom and hold syringe in place for 5 seconds o While the needle is still inserted, slowly move your thumb back, allowing the plunger to rise.  This will release the needle safety guard to safely cover the needle. Then remove the syringe from the injection site.  o If there is blood at the injection site gently press a cotton ball or gauze to the site. Do not rub the injection site.  o Dispose of the used prefilled syringe into a sharps container or hard plastic bottle.      Goals of Therapy     Stimulate the growth of neutrophils (a type of white blood important to fight against infection) used after chemotherapy.    Side Effects & Monitoring Parameters   ? Injection site irritation  ? Pain/aching in the bones, arms and legs    The following side effects should be reported to the provider:  ? Signs of an allergic reaction    Contraindications, Warnings, & Precautions     ? Hypersensitivity  ? Hypersensitivity to latex    Drug/Food Interactions     ? Medication list reviewed in Epic. The patient was instructed to inform the care team before taking any new medications or supplements. No drug interactions identified.     Storage, Handling Precautions, & Disposal     ? Greggory Keen should be stored in the refrigerator.   ?  Avoid freezing syringe but if frozen may be thawed one time  ? Throw away Udenyca syringe that has been left at room temperature for more than 48 hours or frozen more than 1 time  ? Do not shake the prefilled syringe  ? Keep out of the reach of children  ? Place used devices into a sharps container for disposal (which we can supply along with band-aids and alcohol pads) or hard plastic container     Current Medications (including OTC/herbals), Comorbidities and Allergies     Current Outpatient Medications   Medication Sig Dispense Refill   ??? cholecalciferol, vitamin D3, (VITAMIN D3) 5,000 unit tablet Take 5,000 Units by mouth daily. ??? colchicine (MITIGARE) 0.6 mg cap capsule Take as needed for gout flares. Take 1.2 mg at the first sign of flare, followed in 1 hour with a single dose of 0.6. Then take 0.6 mg twice daily until flare resolves 30 capsule 2   ??? loperamide (IMODIUM) 2 mg capsule Take 2 capsules to start, then 1 capsule every 2 hours until diarrhea free for 12 hours. 60 capsule 11   ??? multivitamin (TAB-A-VITE/THERAGRAN) per tablet Take 1 tablet by mouth daily.     ??? ondansetron (ZOFRAN) 8 MG tablet Take 1 tablet (8 mg total) by mouth every eight (8) hours as needed for nausea. 30 tablet 2   ??? prochlorperazine (COMPAZINE) 10 MG tablet Take 1 tablet (10 mg total) by mouth every six (6) hours as needed (nausea). 30 tablet 2     No current facility-administered medications for this visit.        No Known Allergies    Patient Active Problem List   Diagnosis   ??? History of gout   ??? Colon cancer (CMS-HCC)   ??? Iron deficiency anemia due to chronic blood loss   ??? Malignant neoplasm of sigmoid colon (CMS-HCC)       Reviewed and up to date in Epic.    Appropriateness of Therapy     Is medication and dose appropriate based on diagnosis? Yes    Baseline Quality of Life Assessment      How many days over the past month did your Malignant neoplasm of sigmoid colon; Chemotherapy induced neutropenia keep you from your normal activities? 0    Financial Information     Medication Assistance provided: None Required    Anticipated copay of $0 / 28 days reviewed with patient. Verified delivery address.    Delivery Information     Scheduled delivery date: 05/21/19    Expected start date: 05/21/19    Medication will be delivered via Same Day Courier to the prescription address in [facility].  This shipment will not require a signature. Explained the services we provide at [facility] and that each month we would call to set up refills.  Stressed importance of returning phone calls so that we could ensure they receive their medications in time each month.  Informed patient that we should be setting up refills 7-10 days prior to when they will run out of medication.  A pharmacist will reach out to perform a clinical assessment periodically.  Informed patient that a welcome packet and a drug information handout will be sent.      Patient verbalized understanding of the above information as well as how to contact the pharmacy at [phone] option 4 with any questions/concerns.  The pharmacy is open Monday through Friday 8:30am-4:30pm.  A pharmacist is available 24/7 via pager  to answer any clinical questions they may have.    Patient Specific Needs     ? Does the patient have any physical, cognitive, or cultural barriers? No    ? Patient prefers to have medications discussed with  Patient     ? Is the patient able to read and understand education materials at a high school level or above? Yes    ? Patient's primary language is  English     ? Is the patient high risk? No     ? Does the patient require a Care Management Plan? No     ? Does the patient require physician intervention or other additional services (i.e. nutrition, smoking cessation, social work)? No      Sadeen Wiegel A [facility] Specialty Pharmacist

## 2019-05-21 NOTE — Unmapped (Signed)
[facility] Specialty Medication Onboarding    Specialty Medication: Greggory Keen  Prior Authorization: Approved   Financial Assistance: No - copay  <$25  Final Copay/Day Supply: $0 / 2    Insurance Restrictions: None     Notes to Pharmacist: Samuel Bouche sent me a message and asked to check the copay. See Epic chat he added you to for any additional information.    The triage team has completed the benefits investigation and has determined that the patient is able to fill this medication at [facility]. Please contact the patient to complete the onboarding or follow up with the prescribing physician as needed.

## 2019-05-21 NOTE — Unmapped (Signed)
Disconinued Udenyca from treatment plan for infusion clinic appointment. Request sent to scheduling team to cancel appointment. Udenyca obtained via pharmacy benefits with same-day delivery to patient today. Patient is aware and was appreciative of the information.    Care coordination: 15 minutes    Konrad Penta, PharmD, BCOP, CPP  Clinical Pharmacist Practitioner, Gastrointestinal Oncology  Pager: [phone]

## 2019-05-22 NOTE — Unmapped (Signed)
Contacted [name] regarding Mark Calhoun prescription. Onboarded earlier today by [facility] with same-day courier scheduled (expected arrival to patient today). [name] was aware of injection technique, adverse effects, storage, and disposal requirements based on counseling session during onboarding earlier today. He is aware to inject the contents of 1 Udenyca syringe (6 mg) under the skin on day 4 of each FOLFIRI cycle as directed (24 hours after the completion of 5-FU). Patient was appreciative of the call.    Care coordination: 15 minutes    Konrad Penta, PharmD, BCOP, CPP  Clinical Pharmacist Practitioner, Gastrointestinal Oncology  Pager: [phone]

## 2019-06-03 ENCOUNTER — Encounter: Admit: 2019-06-03 | Discharge: 2019-06-03 | Payer: PRIVATE HEALTH INSURANCE

## 2019-06-03 DIAGNOSIS — C187 Malignant neoplasm of sigmoid colon: Principal | ICD-10-CM

## 2019-06-03 LAB — CBC W/ AUTO DIFF
BASOPHILS ABSOLUTE COUNT: 0 10*9/L (ref 0.0–0.1)
BASOPHILS RELATIVE PERCENT: 0.4 %
EOSINOPHILS ABSOLUTE COUNT: 0 10*9/L (ref 0.0–0.7)
EOSINOPHILS RELATIVE PERCENT: 0.7 %
HEMATOCRIT: 36.2 % — ABNORMAL LOW (ref 38.0–50.0)
HEMOGLOBIN: 12.5 g/dL — ABNORMAL LOW (ref 13.5–17.5)
LYMPHOCYTES ABSOLUTE COUNT: 1.2 10*9/L (ref 0.7–4.0)
LYMPHOCYTES RELATIVE PERCENT: 17.3 %
MEAN CORPUSCULAR HEMOGLOBIN CONC: 34.6 g/dL (ref 30.0–36.0)
MEAN CORPUSCULAR HEMOGLOBIN: 29.7 pg (ref 26.0–34.0)
MEAN CORPUSCULAR VOLUME: 86 fL (ref 81.0–95.0)
MEAN PLATELET VOLUME: 6.8 fL — ABNORMAL LOW (ref 7.0–10.0)
MONOCYTES ABSOLUTE COUNT: 0.5 10*9/L (ref 0.1–1.0)
MONOCYTES RELATIVE PERCENT: 6.9 %
NEUTROPHILS ABSOLUTE COUNT: 5.1 10*9/L (ref 1.7–7.7)
PLATELET COUNT: 79 10*9/L — ABNORMAL LOW (ref 150–450)
RED BLOOD CELL COUNT: 4.2 10*12/L — ABNORMAL LOW (ref 4.32–5.72)
RED CELL DISTRIBUTION WIDTH: 15.1 % — ABNORMAL HIGH (ref 12.0–15.0)
WBC ADJUSTED: 6.9 10*9/L (ref 3.5–10.5)

## 2019-06-03 LAB — COMPREHENSIVE METABOLIC PANEL
ALBUMIN: 4.2 g/dL (ref 3.5–5.0)
ALKALINE PHOSPHATASE: 92 U/L (ref 38–126)
ALT (SGPT): 31 U/L (ref <50)
ANION GAP: 7 mmol/L (ref 7–15)
AST (SGOT): 33 U/L (ref 19–55)
BILIRUBIN TOTAL: 1.3 mg/dL — ABNORMAL HIGH (ref 0.0–1.2)
BLOOD UREA NITROGEN: 12 mg/dL (ref 7–21)
BUN / CREAT RATIO: 15
CALCIUM: 9.2 mg/dL (ref 8.5–10.2)
CHLORIDE: 106 mmol/L (ref 98–107)
CO2: 28 mmol/L (ref 22.0–30.0)
CREATININE: 0.82 mg/dL (ref 0.70–1.30)
EGFR CKD-EPI AA MALE: >90 mL/min/{1.73_m2} (ref >=60)
GLUCOSE RANDOM: 105 mg/dL (ref 70–179)
POTASSIUM: 4.2 mmol/L (ref 3.5–5.0)
PROTEIN TOTAL: 6.8 g/dL (ref 6.5–8.3)

## 2019-06-03 LAB — RED BLOOD CELL COUNT: Lab: 4.2 — ABNORMAL LOW

## 2019-06-03 LAB — CARCINOEMBRYONIC ANTIGEN: Carcinoembryonic Ag:MCnc:Pt:Ser/Plas:Qn:: 15.7 — ABNORMAL HIGH

## 2019-06-03 LAB — MAGNESIUM: Magnesium:MCnc:Pt:Ser/Plas:Qn:: 2.1

## 2019-06-03 LAB — SLIDE REVIEW

## 2019-06-03 LAB — SMEAR REVIEW

## 2019-06-03 LAB — AST (SGOT): Aspartate aminotransferase:CCnc:Pt:Ser/Plas:Qn:: 33

## 2019-06-03 MED ADMIN — atropine 0.4 mg/mL injection 0.4 mg: .4 mg | INTRAVENOUS | @ 20:00:00 | Stop: 2019-06-03

## 2019-06-03 MED ADMIN — fluorouracil (ADRUCIL) 5,064 mg in sodium chloride (NS) 0.9 % infusion CADD: 2400 mg/m2 | INTRAVENOUS | @ 22:00:00 | Stop: 2019-06-03

## 2019-06-03 MED ADMIN — dexAMETHasone (DECADRON) tablet 20 mg: 20 mg | ORAL | @ 20:00:00 | Stop: 2019-06-03

## 2019-06-03 MED ADMIN — dextrose 5 % infusion: 100 mL/h | INTRAVENOUS | @ 20:00:00 | Stop: 2019-06-03

## 2019-06-03 MED ADMIN — irinotecan (CAMPTOSAR) 284.8 mg in dextrose 5 % 500 mL IVPB: 135 mg/m2 | INTRAVENOUS | @ 20:00:00 | Stop: 2019-06-03

## 2019-06-03 MED ADMIN — leucovorin 844 mg in dextrose 5 % 50 mL IVPB: 400 mg/m2 | INTRAVENOUS | @ 20:00:00 | Stop: 2019-06-03

## 2019-06-03 MED ADMIN — ondansetron (ZOFRAN) tablet 24 mg: 24 mg | ORAL | @ 20:00:00 | Stop: 2019-06-03

## 2019-06-04 ENCOUNTER — Ambulatory Visit
Admit: 2019-06-04 | Discharge: 2019-06-05 | Payer: PRIVATE HEALTH INSURANCE | Attending: Student in an Organized Health Care Education/Training Program | Primary: Student in an Organized Health Care Education/Training Program

## 2019-06-04 DIAGNOSIS — M1A2610 Drug-induced chronic gout, right knee, without tophus (tophi): Principal | ICD-10-CM

## 2019-06-04 LAB — URIC ACID: Urate:MCnc:Pt:Ser/Plas:Qn:: 8.9

## 2019-06-04 MED ORDER — ALLOPURINOL 100 MG TABLET
ORAL_TABLET | Freq: Every day | ORAL | 3 refills | 90.00000 days | Status: CP
Start: 2019-06-04 — End: 2020-06-03

## 2019-06-04 MED ORDER — COLCHICINE 0.6 MG TABLET
ORAL_TABLET | Freq: Every day | ORAL | 3 refills | 90.00000 days | Status: CP
Start: 2019-06-04 — End: 2020-06-03

## 2019-06-04 NOTE — Unmapped (Signed)
It was a pleasure meeting you Dr Selena Batten! Please continue taking the colchicine for at least another month and start the allopurinol or uloric today!

## 2019-06-04 NOTE — Unmapped (Signed)
[facility] BPs  BP#1 141/89  76   BP#2 134/87  79  BP#3 148/90  84    Average BP 141/89  80  (please note this as a comment in vitals)

## 2019-06-04 NOTE — Unmapped (Signed)
VS taken. Labs drawn via [place].    Port flushed and brisk blood return  noted.  Labs within treatment parameters.  Premeds and IV fluids given.    Pt received Irinotecan and Leucovorin - no adverse events noted.   5 FU continuous infusion via cadd started. Pump running and green light flashing.    AVS refused.  Pt stable and ambulated independently from clinic.  No concerns voiced.  VWilliams,RN.

## 2019-06-05 NOTE — Unmapped (Signed)
[facility]    Assessment & Plan:   Mark Calhoun is a 53 y.o. male with a past medical history of gout and recurrent sigmoid cancer s/p resection and chemotherapy (CapeOX x8, now on FOLFIRI dose reduced for cytopenia) that comes to clinic with right knee pain and swelling responsive to colchicine, most likely gout.    Gout  Not definitively diagnosed given lack of joint aspirate, but history and exam not consistent with autoimmune, osteoarthritic, or infectious etiologies. No recent uric acid level. Could be pseudogout.  - Will add on a uric acid level  - Discussed merits of uloric vs allopurinol with pharmacy staff. Per pharmacy, allopurinol is compatible with his current chemotherapy. Will start allopurinol 100 daily  - Refilled colchicine, 0.6 mg po qday    HTN  Pt was mildly hypertensive in clinic today, with SBP 141. Using shared decision making we elected to defer on starting antihypertensives at this time given his metastatic GI cancer.    Health Maintenance  - Pt elected to defer on hepatitis C screen, Tdap at this time  - CRC screening not indicated    Can Follow Up PRN    Patient was seen and discussed with Dr. Johnsie Kindred who is in agreement with the assessment and plan as outlined above.     Subjective: Mark Calhoun is a 53 y.o. male with a past medical history of gout and recurrent sigmoid cancer s/p resection and chemotherapy (CapeOX x8, now on FOLFIRI dose reduced for cytopenia) that comes to clinic for concern about gout. Since he started getting chemotherapy with steroids he has had what he describes as gout flares. He has had 4 flares in the last year and a half. The flares have been in both knees and both ankles. He tells me a rheumatologist tried to aspirate one of his ankles but only got a bloody tap. Most recently he had a flare in his right knee that started 4 days ago. Whenever he has a flare he takes colchicine and it immediately resolves the flare. He last took colchicine yesterday and now his right knee's mobility is greatly improved and the swelling has resolved.     We also spoke about his ongoing battle with metastatic GI cancer. He thinks he has 2 years left to live, but he does not get fixated on this and continues to enjoy his life. 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    Review of Systems: 10 point ROS was performed and is negative other than as mentioned in the HPI.     Medications and Allergies: Reviewed and updated in Epic    Problem List:  Patient Active Problem List   Diagnosis   ??? History of gout   ??? Colon cancer (CMS-HCC)   ??? Iron deficiency anemia due to chronic blood loss   ??? Malignant neoplasm of sigmoid colon (CMS-HCC)       Objective:     Vitals:    06/04/19 1423   BP: 141/89   Pulse: 80   Temp: 36.9 ??C   SpO2: 97%     General: well-appearing in NAD  Eyes: EOMI, sclera clear, PERRL  ENT: OP clear w/o erythema or exudate  CV: regular, no murmurs  Resp: CTAB, no wheezes or crackles, normal WOB  GI: soft, NTND, no r/g. MSK: slightly reduced range of motion  in the right knee. No swelling, erythema, or tenderness to palpation. The left knee and bilateral ankles have normal range of motion w/o swelling or erythema  Skin: clean and dry, no rashes or lesions noted  Ext: no cyanosis/clubbing/edema  Neuro: alert, follows commands. CN II-XII grossly intact    Procedure: None  See procedure note from this encounter    Metric Tracker:  Did today's visit result in referral to ED or direct admission? No

## 2019-06-09 NOTE — Unmapped (Signed)
[facility] Clinical Assessment & Refill Coordination Note    Garv Kuechle, DOB: [birth year]  Phone: [phone] (home)     All above HIPAA information was verified with patient.     Was a Nurse, learning disability used for this call? No    Specialty Medication(s):   Hematology/Oncology: [name]     Current Outpatient Medications   Medication Sig Dispense Refill   ??? allopurinoL (ZYLOPRIM) 100 MG tablet Take 1 tablet (100 mg total) by mouth daily. 90 tablet 3   ??? cholecalciferol, vitamin D3, (VITAMIN D3) 5,000 unit tablet Take 5,000 Units by mouth daily.     ??? colchicine (COLCRYS) 0.6 mg tablet Take 1 tablet (0.6 mg total) by mouth daily. 90 tablet 3   ??? colchicine (MITIGARE) 0.6 mg cap capsule Take as needed for gout flares. Take 1.2 mg at the first sign of flare, followed in 1 hour with a single dose of 0.6. Then take 0.6 mg twice daily until flare resolves 30 capsule 2   ??? multivitamin (TAB-A-VITE/THERAGRAN) per tablet Take 1 tablet by mouth daily.       No current facility-administered medications for this visit.         Changes to medications: Cannan reports no changes at this time.    No Known Allergies    Changes to allergies: No    SPECIALTY MEDICATION ADHERENCE     Udenyca 6  mg /0.92mL: 0 days of medicine on hand     Medication Adherence    Patient reported X missed doses in the last month: 0  Specialty Medication: Udenyca 6 mg/ 0.75mL  Informant: patient          Specialty medication(s) dose(s) confirmed: Regimen is correct and unchanged.     Are there any concerns with adherence? No    Adherence counseling provided? Not needed    CLINICAL MANAGEMENT AND INTERVENTION      Clinical Benefit Assessment:    Do you feel the medicine is effective or helping your condition? Yes    Clinical Benefit counseling provided? Not needed    Adverse Effects Assessment: Are you experiencing any side effects? Yes, patient reports experiencing little bit or localized itching at injection site with first injection.  Nothing with 2nd. Side effect counseling provided: Can thin layer of HC 1% cream to itchy area if needed    Are you experiencing difficulty administering your medicine? No    Quality of Life Assessment:    How many days over the past month did your Colon cancer/chemotherapy induced neutropenia  keep you from your normal activities? For example, brushing your teeth or getting up in the morning. 0    Have you discussed this with your provider? Not needed    Therapy Appropriateness:    Is therapy appropriate? Yes, therapy is appropriate and should be continued    DISEASE/MEDICATION-SPECIFIC INFORMATION      For patients on injectable medications: Patient currently has 0 doses left.  Next injection is scheduled for 06/20/19.    PATIENT SPECIFIC NEEDS     ? Does the patient have any physical, cognitive, or cultural barriers? No    ? Is the patient high risk? No     ? Does the patient require a Care Management Plan? No     ? Does the patient require physician intervention or other additional services (i.e. nutrition, smoking cessation, social work)? No      SHIPPING     Specialty Medication(s) to be Shipped:  Hematology/Oncology: [name]    Other medication(s) to be shipped: none     Changes to insurance: No    Delivery Scheduled: Yes, Expected medication delivery date: 06/16/19.     Medication will be delivered via UPS to the confirmed prescription address in [facility].    The patient will receive a drug information handout for each medication shipped and additional FDA Medication Guides as required.  Verified that patient has previously received a Conservation officer, historic buildings.    All of the patient's questions and concerns have been addressed.    Breck Coons [facility] Specialty Pharmacist

## 2019-06-10 NOTE — Unmapped (Signed)
I spoke with Dr Selena Batten about his allopurinol dose. I advised him to do 50 mg for 2-4 weeks given his [place] heritage. He voiced understanding. He just went through another round of chemo but is doing well. He has not had any new gout symptoms since we met.

## 2019-06-15 DIAGNOSIS — C187 Malignant neoplasm of sigmoid colon: Principal | ICD-10-CM

## 2019-06-16 MED FILL — UDENYCA 6 MG/0.6 ML SUBCUTANEOUS SYRINGE: 28 days supply | Qty: 1 | Fill #1 | Status: AC

## 2019-06-16 MED FILL — UDENYCA 6 MG/0.6 ML SUBCUTANEOUS SYRINGE: SUBCUTANEOUS | 28 days supply | Qty: 1.2 | Fill #1

## 2019-06-17 ENCOUNTER — Encounter: Admit: 2019-06-17 | Discharge: 2019-06-17 | Payer: PRIVATE HEALTH INSURANCE

## 2019-06-17 ENCOUNTER — Encounter
Admit: 2019-06-17 | Discharge: 2019-06-17 | Payer: PRIVATE HEALTH INSURANCE | Attending: Hematology & Oncology | Primary: Hematology & Oncology

## 2019-06-17 DIAGNOSIS — C187 Malignant neoplasm of sigmoid colon: Principal | ICD-10-CM

## 2019-06-17 LAB — CBC W/ AUTO DIFF
BASOPHILS ABSOLUTE COUNT: 0 10*9/L (ref 0.0–0.1)
BASOPHILS RELATIVE PERCENT: 0.4 %
EOSINOPHILS ABSOLUTE COUNT: 0.1 10*9/L (ref 0.0–0.7)
EOSINOPHILS RELATIVE PERCENT: 2.3 %
HEMATOCRIT: 34.5 % — ABNORMAL LOW (ref 38.0–50.0)
HEMOGLOBIN: 11.9 g/dL — ABNORMAL LOW (ref 13.5–17.5)
LYMPHOCYTES ABSOLUTE COUNT: 0.9 10*9/L (ref 0.7–4.0)
LYMPHOCYTES RELATIVE PERCENT: 17.4 %
MEAN CORPUSCULAR HEMOGLOBIN CONC: 34.5 g/dL (ref 30.0–36.0)
MEAN CORPUSCULAR HEMOGLOBIN: 29.9 pg (ref 26.0–34.0)
MEAN CORPUSCULAR VOLUME: 86.8 fL (ref 81.0–95.0)
MEAN PLATELET VOLUME: 7.1 fL (ref 7.0–10.0)
NEUTROPHILS ABSOLUTE COUNT: 3.8 10*9/L (ref 1.7–7.7)
NEUTROPHILS RELATIVE PERCENT: 73.4 %
PLATELET COUNT: 94 10*9/L — ABNORMAL LOW (ref 150–450)
RED BLOOD CELL COUNT: 3.97 10*12/L — ABNORMAL LOW (ref 4.32–5.72)
RED CELL DISTRIBUTION WIDTH: 16.6 % — ABNORMAL HIGH (ref 12.0–15.0)
WBC ADJUSTED: 5.1 10*9/L (ref 3.5–10.5)

## 2019-06-17 LAB — CREATININE: CREATININE: 0.79 mg/dL (ref 0.70–1.30)

## 2019-06-17 LAB — POTASSIUM: Potassium:SCnc:Pt:Ser/Plas:Qn:: 4.3

## 2019-06-17 LAB — BILIRUBIN TOTAL: Bilirubin:MCnc:Pt:Ser/Plas:Qn:: 0.9

## 2019-06-17 LAB — MAGNESIUM: Magnesium:MCnc:Pt:Ser/Plas:Qn:: 2.1

## 2019-06-17 LAB — LYMPHOCYTES RELATIVE PERCENT: Lymphocytes/100 leukocytes:NFr:Pt:Bld:Qn:Automated count: 17.4

## 2019-06-17 LAB — EGFR CKD-EPI AA MALE: Lab: >90

## 2019-06-17 LAB — POLYCHROMASIA

## 2019-06-17 MED ADMIN — fluorouracil (ADRUCIL) 5,064 mg in sodium chloride (NS) 0.9 % infusion CADD: 2400 mg/m2 | INTRAVENOUS | @ 20:00:00 | Stop: 2019-06-17

## 2019-06-17 MED ADMIN — atropine 0.4 mg/mL injection 0.4 mg: .4 mg | INTRAVENOUS | @ 18:00:00 | Stop: 2019-06-17

## 2019-06-17 MED ADMIN — dextrose 5 % infusion: 100 mL/h | INTRAVENOUS | @ 17:00:00 | Stop: 2019-06-17

## 2019-06-17 MED ADMIN — dexAMETHasone (DECADRON) tablet 20 mg: 20 mg | ORAL | @ 17:00:00 | Stop: 2019-06-17

## 2019-06-17 MED ADMIN — irinotecan (CAMPTOSAR) 284.8 mg in dextrose 5 % 500 mL IVPB: 135 mg/m2 | INTRAVENOUS | @ 18:00:00 | Stop: 2019-06-17

## 2019-06-17 MED ADMIN — ondansetron (ZOFRAN) tablet 24 mg: 24 mg | ORAL | @ 17:00:00 | Stop: 2019-06-17

## 2019-06-17 MED ADMIN — leucovorin 844 mg in dextrose 5 % 50 mL IVPB: 400 mg/m2 | INTRAVENOUS | @ 18:00:00 | Stop: 2019-06-17

## 2019-06-17 NOTE — Unmapped (Signed)
[facility]     PRIMARY CARE PROVIDER:  Carlynn Herald, MD  [address]  [place] [address]    CONSULTING PROVIDERS  Dr. Nemiah Commander, [facility]orectal [facility]   ____________________________________________________________________    CANCER HISTORY  Diagnosis 03/2018 stage III colon cancer presenting with severe anemia   03/17/18 lap left/sigmoid colectomy: pT3N1b (3/42) moderately differentiated adenocarcinoma, discontig tumor nodules present, intermediate tumor budding,  IHC intact for MMR enzymes (MLH1, MSH2, MSH6, PMS2). preop CEA 4.2   04/09/18-08/2018: CapeOX x 8, final cycle capecitabine only   04/2019 surveillance detected recurrence   05/06/19- present FOLFIRI-- dose reduced for tpenia    UGT1A1 *1/28    Tumor Molecular Genetics:  Tempus on primary;   TP53 mutation  KRAS/NRAS/BRAF WT    TREATMENT PLAN:  FOLFIRI x 6 then CRS/HIPEC  ____________________________________________________________________    ASSESSMENT  1. Recurrent, metastatic sigmoid colon cancer, improving CEA  2. Gout  4. 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 FOLFIRI (irinotecan dose reduced for UGT1A1 *28 heterozygote + tpenia)  2. cont Udenyca  3.  Gout-- agree with allopurinol   4. Scan next week; RTC in 2 weeks for treatment, 4 weeks for visit and treatment ; back to Dr. Meredith Mody around cycle  6    DISCUSSION  Clinically tolerating treatment well. Still with cytopenias and I do not think we should further escalate irinotecan dose. CEA suggestive of early treatment response and encouraging    reivewed TEMPUS results.     Rectal bleeding likely hemorrhodal given recent normal cscope and known hemorrhoids. Nothing to do now.   ______________________________________________________________________  HISTORY Mark Calhoun is seen today at the [facility] for ongoing management regarding colon cancer    Mark Calhoun is doing well-- a bit more BRBPR during period when he was having diarrhea. Has ~4-6 bm a day for 3 days after chemo, tolerable to him.   Energy is fine. Working.   He is eating well, weight stable  Gout flared again in knee. Now settled down after colchicine, and starting allopurinol this weekend.     MEDICAL HISTORY  Interval: none  Prior:   1. Gout-  2. oxaliplatin-induced splenomegaly    No Known Allergies  Medications reviewed in the EMR    SOCIAL HISTORY   Current:  Comes with his wife Mark Calhoun.   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 His brother Mark Calhoun is my partner in medical oncology. Mark Calhoun is married and has two teenage daughters (17 and 48 as of 03/2018).     REVIEW OF SYSTEMS  The remainder of a comprehensive 10 systems review is negative.    PHYSICAL EXAM  BP 137/91  - Pulse 78  - Temp 36.9 ??C (98.4 ??F) (Temporal)  - Resp 16  - Ht 180.3 cm (5' 11)  - Wt 88.3 kg (194 lb 9.6 oz)  - SpO2 98%  - BMI 27.14 kg/m??    GENERAL: well developed, well nourished, in no distress  HEENT: NCAT, pupils equal, sclerae anicteric, PSYCH: full and appropriate range of affect with good insight and judgement  LYMPH: No cervical or supraclav adenopathy  LUNGS: clear bilaterally with good air excursion and no wheezing, rhonci, rales  COR: reg,  no murmurs, rubs, gallops   GI: abdomen soft, nontender, no ascites, normoactive BS, no hepatosplenomegaly or masses.  EXT: no edema  SKIN: No rashes   OBJECTIVE DATA  Labs reviewed in emr   plts a bit better this cycle    RADIOLOGY RESULTS   None today

## 2019-06-17 NOTE — Unmapped (Signed)
Pt states he hasn't received Udenyca as of yet.

## 2019-06-18 NOTE — Unmapped (Signed)
VS taken. Labs drawn via [place] when pt met with Provider.    Port flushed and brisk blood return noted.  Labs within treatment parameters.    Premeds and IV fluids given.  Pt received Irinotecan and Leucovorin - no adverse events noted.   5 FU cadd started at 2.3 ml/hr to be infused over 46 hours.  Pump running and green light flashing.    AVS refused.  Pt stable and ambulated independently from clinic.  No concerns voiced.  VWilliams,RN.

## 2019-06-24 ENCOUNTER — Ambulatory Visit: Admit: 2019-06-24 | Discharge: 2019-06-25 | Payer: PRIVATE HEALTH INSURANCE

## 2019-06-24 DIAGNOSIS — C187 Malignant neoplasm of sigmoid colon: Principal | ICD-10-CM

## 2019-06-24 MED ADMIN — iohexoL (OMNIPAQUE) 350 mg iodine/mL solution 100 mL: 100 mL | INTRAVENOUS | @ 15:00:00 | Stop: 2019-06-24

## 2019-06-30 ENCOUNTER — Encounter: Admit: 2019-06-30 | Discharge: 2019-07-01 | Payer: PRIVATE HEALTH INSURANCE

## 2019-06-30 LAB — CBC W/ AUTO DIFF
BASOPHILS RELATIVE PERCENT: 0.5 %
EOSINOPHILS ABSOLUTE COUNT: 0.1 10*9/L (ref 0.0–0.7)
EOSINOPHILS RELATIVE PERCENT: 1.3 %
HEMATOCRIT: 34.7 % — ABNORMAL LOW (ref 38.0–50.0)
HEMOGLOBIN: 11.9 g/dL — ABNORMAL LOW (ref 13.5–17.5)
LYMPHOCYTES ABSOLUTE COUNT: 1 10*9/L (ref 0.7–4.0)
LYMPHOCYTES RELATIVE PERCENT: 15.6 %
MEAN CORPUSCULAR HEMOGLOBIN CONC: 34.3 g/dL (ref 30.0–36.0)
MEAN CORPUSCULAR VOLUME: 88.3 fL (ref 81.0–95.0)
MEAN PLATELET VOLUME: 7.3 fL (ref 7.0–10.0)
MONOCYTES ABSOLUTE COUNT: 0.4 10*9/L (ref 0.1–1.0)
MONOCYTES RELATIVE PERCENT: 6.6 %
NEUTROPHILS ABSOLUTE COUNT: 4.8 10*9/L (ref 1.7–7.7)
NEUTROPHILS RELATIVE PERCENT: 76 %
PLATELET COUNT: 102 10*9/L — ABNORMAL LOW (ref 150–450)
RED BLOOD CELL COUNT: 3.93 10*12/L — ABNORMAL LOW (ref 4.32–5.72)
RED CELL DISTRIBUTION WIDTH: 17.9 % — ABNORMAL HIGH (ref 12.0–15.0)
WBC ADJUSTED: 6.3 10*9/L (ref 3.5–10.5)

## 2019-06-30 LAB — COMPREHENSIVE METABOLIC PANEL
ALBUMIN: 4.3 g/dL (ref 3.5–5.0)
ALKALINE PHOSPHATASE: 85 U/L (ref 38–126)
ALT (SGPT): 21 U/L (ref <50)
ANION GAP: 8 mmol/L (ref 7–15)
AST (SGOT): 28 U/L (ref 19–55)
BLOOD UREA NITROGEN: 8 mg/dL (ref 7–21)
BUN / CREAT RATIO: 10
CALCIUM: 8.6 mg/dL (ref 8.5–10.2)
CHLORIDE: 107 mmol/L (ref 98–107)
CO2: 26 mmol/L (ref 22.0–30.0)
CREATININE: 0.82 mg/dL (ref 0.70–1.30)
EGFR CKD-EPI AA MALE: >90 mL/min/{1.73_m2} (ref >=60)
EGFR CKD-EPI NON-AA MALE: >90 mL/min/{1.73_m2} (ref >=60)
GLUCOSE RANDOM: 173 mg/dL (ref 70–179)
POTASSIUM: 4.1 mmol/L (ref 3.5–5.0)
PROTEIN TOTAL: 6.8 g/dL (ref 6.5–8.3)
SODIUM: 141 mmol/L (ref 135–145)

## 2019-06-30 LAB — CARCINOEMBRYONIC ANTIGEN: Carcinoembryonic Ag:MCnc:Pt:Ser/Plas:Qn:: 10.7 — ABNORMAL HIGH

## 2019-06-30 LAB — POLYCHROMASIA

## 2019-06-30 LAB — MAGNESIUM: Magnesium:MCnc:Pt:Ser/Plas:Qn:: 2.1

## 2019-06-30 LAB — HEMOGLOBIN: Hemoglobin:MCnc:Pt:Bld:Qn:: 11.9 — ABNORMAL LOW

## 2019-06-30 LAB — EGFR CKD-EPI AA MALE: Lab: >90

## 2019-06-30 LAB — SLIDE REVIEW

## 2019-06-30 MED ADMIN — leucovorin 844 mg in dextrose 5 % 50 mL IVPB: 400 mg/m2 | INTRAVENOUS | @ 19:00:00 | Stop: 2019-06-30

## 2019-06-30 MED ADMIN — dextrose 5 % infusion: 100 mL/h | INTRAVENOUS | @ 19:00:00 | Stop: 2019-06-30

## 2019-06-30 MED ADMIN — fluorouracil (ADRUCIL) 5,064 mg in sodium chloride (NS) 0.9 % infusion CADD: 2400 mg/m2 | INTRAVENOUS | @ 21:00:00 | Stop: 2019-06-30

## 2019-06-30 MED ADMIN — ondansetron (ZOFRAN) tablet 24 mg: 24 mg | ORAL | @ 19:00:00 | Stop: 2019-06-30

## 2019-06-30 MED ADMIN — atropine 0.4 mg/mL injection 0.4 mg: .4 mg | INTRAVENOUS | @ 19:00:00 | Stop: 2019-06-30

## 2019-06-30 MED ADMIN — irinotecan (CAMPTOSAR) 284.8 mg in dextrose 5 % 500 mL IVPB: 135 mg/m2 | INTRAVENOUS | @ 19:00:00 | Stop: 2019-06-30

## 2019-06-30 MED ADMIN — dexAMETHasone (DECADRON) tablet 20 mg: 20 mg | ORAL | @ 19:00:00 | Stop: 2019-06-30

## 2019-07-01 NOTE — Unmapped (Signed)
Pt arrived for infusion. Port accessed, Blood return noted,  labs sent, Labs w/n treatable limit, no pain or discomfort reported, infusion completed, Pt tolerated treatment well, Home infusion pump checked and attached to pt, pump started, AVS printed, pt d/c home with self care.

## 2019-07-02 NOTE — Unmapped (Signed)
Hem/Onc Phone Triage Note    Caller: [name]    Reason for Call:   Mark Calhoun is a 54 y.o. M with CRC currently on FOLFIRI who calls tonight after accidentally disconnecting his 5-FU pump approximately 1/2 way through normal treatment cycle. Per his report there is still approx 45cc/106cc still in his cartridge. He is requesting guidance on what to do ie reconnnect or return to [facility].    Assessment/Plan:   Pt instructed NOT to reconnect his pump and continue infusion at home. Unfortunately infusion clinic is closed tomorrow 1/1 for the Holiday. We will try and facilitate him coming in Saturday to complete infusion with the remainder of his already dispensed medication. Pt is aware that he will need to bring his current dose with him.    I will forward this note to his primary oncologist and nurse navigator.      Please page Oncology Consults at [phone] if patient needs admission or questions about care occur.     Fellow Taking Call:  Jill Alexanders  July 02, 2019 1:35 AM

## 2019-07-04 ENCOUNTER — Encounter: Admit: 2019-07-04 | Discharge: 2019-07-05 | Payer: PRIVATE HEALTH INSURANCE

## 2019-07-04 NOTE — Unmapped (Signed)
Reviewed plan of care with patient regarding early disconnect of 5FU CADD pump on 12/31. Explained that we cannot reconnect the tubing given it has been uncapped and exposed to air since 12/31 and has a high risk for contamination. Port site is clean and dry, patient feels fine, verbalized deaccessing by the appropriate process. Inbasket message sent to primary team for follow up regarding self administered neulasta injection. Patient pleased to have the issue resolved.

## 2019-07-05 NOTE — Unmapped (Signed)
[facility] Refill Coordination Note    Specialty Medication(s) to be Shipped:   Hematology/Oncology: Mark Calhoun    Other medication(s) to be shipped: N/A     Mark Calhoun, DOB: 1966-05-06  Phone: [phone] (home)       All above HIPAA information was verified with patient.     Was a Nurse, learning disability used for this call? No    Completed refill call assessment today to schedule patient's medication shipment from the [facility] [phone]).       Specialty medication(s) and dose(s) confirmed: Regimen is correct and unchanged.   Changes to medications: Mark Calhoun reports no changes at this time.  Changes to insurance: No  Questions for the pharmacist: No    Confirmed patient received Welcome Packet with first shipment. The patient will receive a drug information handout for each medication shipped and additional FDA Medication Guides as required.       DISEASE/MEDICATION-SPECIFIC INFORMATION        For patients on injectable medications: Patient currently has 1 doses left.  Next injection is scheduled for 07/05/19.    SPECIALTY MEDICATION ADHERENCE     Medication Adherence    Patient reported X missed doses in the last month: 0  Specialty Medication: Udenyca 6mg /0.75mL  Patient is on additional specialty medications: No  Informant: patient                Udenyca 6mg /0.6 mL: 1 days of medicine on hand         SHIPPING     Shipping address confirmed in Epic.     Delivery Scheduled: Yes, Expected medication delivery date: 07/14/19.     Medication will be delivered via UPS to the prescription address in Epic [place].    Mark Calhoun   [facility] Specialty Technician

## 2019-07-05 NOTE — Unmapped (Signed)
Contacted patient by phone regarding plan for Udenyca given that he reported to infusion team he disconnected 5-FU pump inadvertently approximately half way through the infusion (5-FU infusion started on 06/30/19 as part of FOLFIRI regimen).    After discussion with primary oncologist, patient made aware to administer the contents of (1) Udenyca syringe now (07/05/19). Subsequent planning for adjustment of next chemotherapy infusion date to next Friday (07/16/19) given later growth factor administration this cycle is currently being worked on (patient's preference would be the afternoon on that day).    Patient is aware and was appreciative of the information.    Care coordination: 10 minutes    Konrad Penta, PharmD, BCOP, CPP  Clinical Pharmacist Practitioner, Gastrointestinal Oncology  Pager: [phone]

## 2019-07-13 MED FILL — UDENYCA 6 MG/0.6 ML SUBCUTANEOUS SYRINGE: 28 days supply | Qty: 1 | Fill #2 | Status: AC

## 2019-07-13 MED FILL — UDENYCA 6 MG/0.6 ML SUBCUTANEOUS SYRINGE: SUBCUTANEOUS | 28 days supply | Qty: 1.2 | Fill #2

## 2019-07-16 ENCOUNTER — Ambulatory Visit: Admit: 2019-07-16 | Discharge: 2019-07-17 | Payer: PRIVATE HEALTH INSURANCE

## 2019-07-16 ENCOUNTER — Encounter
Admit: 2019-07-16 | Discharge: 2019-07-17 | Payer: PRIVATE HEALTH INSURANCE | Attending: Hematology & Oncology | Primary: Hematology & Oncology

## 2019-07-16 ENCOUNTER — Encounter: Admit: 2019-07-16 | Discharge: 2019-07-17 | Payer: PRIVATE HEALTH INSURANCE

## 2019-07-16 DIAGNOSIS — C187 Malignant neoplasm of sigmoid colon: Principal | ICD-10-CM

## 2019-07-16 LAB — CREATININE
EGFR CKD-EPI AA MALE: >90 mL/min/{1.73_m2} (ref >=60)
EGFR CKD-EPI NON-AA MALE: >90 mL/min/{1.73_m2} (ref >=60)

## 2019-07-16 LAB — MAGNESIUM: Magnesium:MCnc:Pt:Ser/Plas:Qn:: 2.2

## 2019-07-16 LAB — EGFR CKD-EPI NON-AA MALE
Glomerular filtration rate/1.73 sq M.predicted.non black:ArVRat:Pt:Ser/Plas/Bld:Qn:Creatinine-based formula (CKD-EPI): >90

## 2019-07-16 LAB — CBC W/ AUTO DIFF
BASOPHILS ABSOLUTE COUNT: 0 10*9/L (ref 0.0–0.1)
BASOPHILS RELATIVE PERCENT: 0.5 %
EOSINOPHILS ABSOLUTE COUNT: 0.1 10*9/L (ref 0.0–0.4)
EOSINOPHILS RELATIVE PERCENT: 1.8 %
HEMATOCRIT: 35.3 % — ABNORMAL LOW (ref 41.0–53.0)
HEMOGLOBIN: 11.8 g/dL — ABNORMAL LOW (ref 13.5–17.5)
LARGE UNSTAINED CELLS: 1 % (ref 0–4)
LYMPHOCYTES ABSOLUTE COUNT: 0.9 10*9/L — ABNORMAL LOW (ref 1.5–5.0)
LYMPHOCYTES RELATIVE PERCENT: 12.9 %
MEAN CORPUSCULAR HEMOGLOBIN CONC: 33.3 g/dL (ref 31.0–37.0)
MEAN CORPUSCULAR HEMOGLOBIN: 30.6 pg (ref 26.0–34.0)
MEAN CORPUSCULAR VOLUME: 91.9 fL (ref 80.0–100.0)
MEAN PLATELET VOLUME: 8.1 fL (ref 7.0–10.0)
MONOCYTES ABSOLUTE COUNT: 0.3 10*9/L (ref 0.2–0.8)
MONOCYTES RELATIVE PERCENT: 3.7 %
NEUTROPHILS ABSOLUTE COUNT: 5.4 10*9/L (ref 2.0–7.5)
PLATELET COUNT: 85 10*9/L — ABNORMAL LOW (ref 150–440)
RED BLOOD CELL COUNT: 3.84 10*12/L — ABNORMAL LOW (ref 4.50–5.90)
RED CELL DISTRIBUTION WIDTH: 18.1 % — ABNORMAL HIGH (ref 12.0–15.0)
WBC ADJUSTED: 6.7 10*9/L (ref 4.5–11.0)

## 2019-07-16 LAB — PLATELET COUNT: Platelets:NCnc:Pt:Bld:Qn:Automated count: 85 — ABNORMAL LOW

## 2019-07-16 LAB — BILIRUBIN TOTAL: Bilirubin:MCnc:Pt:Ser/Plas:Qn:: 0.9

## 2019-07-16 LAB — POTASSIUM: Potassium:SCnc:Pt:Ser/Plas:Qn:: 4.1

## 2019-07-16 NOTE — Unmapped (Signed)
 GI MEDICAL ONCOLOGY     PRIMARY CARE PROVIDER:  Carlynn Herald, MD  [address]  [place] [address]    CONSULTING PROVIDERS  Dr. Nemiah Commander, [facility]   ____________________________________________________________________    CANCER HISTORY  Diagnosis 03/2018 stage III colon cancer presenting with severe anemia   03/17/18 lap left/sigmoid colectomy: pT3N1b (3/42) moderately differentiated adenocarcinoma, discontig tumor nodules present, intermediate tumor budding,  IHC intact for MMR enzymes (MLH1, MSH2, MSH6, PMS2). preop CEA 4.2   04/09/18-08/2018: CapeOX x 8, final cycle capecitabine only   04/2019 surveillance detected recurrence   05/06/19- present FOLFIRI-- dose reduced for tpenia    UGT1A1 *1/28    Tumor Molecular Genetics:  Tempus on primary;   TP53 mutation  KRAS/NRAS/BRAF WT    TREATMENT PLAN:  FOLFIRI x 6 then CRS/HIPEC  ____________________________________________________________________    ASSESSMENT  1. Recurrent, metastatic sigmoid colon cancer, improving CEA  2. Gout  4. 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 FOLFIRI (irinotecan dose reduced for UGT1A1 *28 heterozygote + tpenia), final pre-op cycle today  2 hepatosplenomegaly-- likely related to chemo. Checking hepatitis panels   3. Gout-- agree with allopurinol   4. Back to [place] ASAP    DISCUSSION  Mark Calhoun is clinically well, still with some mild residual symptoms likely attributable to LLQ disease. At present with the hepatosplenomeg, I do not think it prudent to continue additional chemo beyond 6 cycles if we are able to proceed with CRS/HIPEC. Do not want to cause him further treatment related issues and risk complications.     Reviewed uncertainly about long term prognosis. We do not usually see long term cures from CRS/HIPEC in patients with carcinomatosis though we can see 5+ year survival. For Mark Calhoun, I am hopeful that perhaps this is a more localized recurrence which would give him a better chance of longer term control, and perhaps a longshot at cure. But, we are uncertain about extent of disease.   ______________________________________________________________________  HISTORY     Mark Calhoun is seen today at the [facility] for ongoing management regarding colon cancer    Mark Calhoun reports he feels ok. Tolerating chemo well. Really no major issues, continues to work. Same LLQ discomfort.   Mild gout flares.    MEDICAL HISTORY  Interval: none  Prior:   1. Gout-  2. oxaliplatin-induced splenomegaly    No Known Allergies  Medications reviewed in the EMR    SOCIAL HISTORY   Current:  Comes with his wife Mark Calhoun.   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 His brother Mark Calhoun is my partner in medical oncology. Mark Calhoun is married and has two teenage daughters (17 and 10 as of 03/2018).      PHYSICAL EXAM  BP 151/88  - Pulse 78  - Temp 36.7 ??C (98.1 ??F)  - Resp 16  - Wt 89 kg (196 lb 3.2 oz)  - SpO2 98%  - BMI 27.36 kg/m??    GENERAL: well developed, well nourished, in no distress  HEENT: NCAT, pupils equal, sclerae anicteric, PSYCH: full and appropriate range of affect with good insight and judgement    OBJECTIVE DATA  Labs reviewed in emr   plts a bit better this cycle    RADIOLOGY RESULTS   None today

## 2019-07-16 NOTE — Unmapped (Signed)
Port accessed by [employer], labs drawn and sent.

## 2019-07-16 NOTE — Unmapped (Signed)
Notified that patient received bill related to Udenyca injections from [facility]; he informed clinic staff that Udenyca injections aren't covered by his insurance (being filled via [facility] pharmacy). Based on documentation in Epic, fills are processing through insurance and copay card is being used to bring remainder of cost $0. SSC team notified and they plan to reach out to discuss further with Mr. Brawley.    Care coordination: 5 minutes    Konrad Penta, PharmD, BCOP, CPP  Clinical Pharmacist Practitioner, Gastrointestinal Oncology  Pager: [phone]

## 2019-07-17 NOTE — Unmapped (Signed)
Mark Calhoun infused uneventfully & 46hrs of 5FU/pump attached to port. Ensured green light &running signs are on. AVS given & Pt d/c in ambulatorily.

## 2019-07-19 LAB — HEPATITIS B SURFACE ANTIBODY: HEPATITIS B SURFACE ANTIBODY: REACTIVE — AB

## 2019-07-19 LAB — HEPATITIS B SURFACE ANTIGEN: Hepatitis B virus surface Ag:PrThr:Pt:Ser:Ord:: NONREACTIVE

## 2019-07-19 LAB — HEPATITIS C ANTIBODY
HEPATITIS C ANTIBODY: NONREACTIVE
Hepatitis C virus Ab:PrThr:Pt:Ser:Ord:: NONREACTIVE

## 2019-07-19 LAB — HEPATITIS B SURFACE ANTIBODY QUANT: Hepatitis B virus surface Ab:ACnc:Pt:Ser:Qn:: 136.83 — ABNORMAL HIGH

## 2019-07-21 ENCOUNTER — Encounter: Admit: 2019-07-21 | Discharge: 2019-07-22 | Payer: PRIVATE HEALTH INSURANCE

## 2019-07-21 ENCOUNTER — Encounter: Admit: 2019-07-21 | Discharge: 2019-07-22 | Payer: PRIVATE HEALTH INSURANCE | Attending: Surgery | Primary: Surgery

## 2019-07-21 DIAGNOSIS — C187 Malignant neoplasm of sigmoid colon: Principal | ICD-10-CM

## 2019-07-21 DIAGNOSIS — Z9081 Acquired absence of spleen: Principal | ICD-10-CM

## 2019-07-21 LAB — CBC W/ AUTO DIFF
BASOPHILS ABSOLUTE COUNT: 0 10*9/L (ref 0.0–0.1)
EOSINOPHILS ABSOLUTE COUNT: 0.1 10*9/L (ref 0.0–0.7)
EOSINOPHILS RELATIVE PERCENT: 0.4 %
HEMATOCRIT: 35.3 % — ABNORMAL LOW (ref 38.0–50.0)
HEMOGLOBIN: 11.7 g/dL — ABNORMAL LOW (ref 13.5–17.5)
LYMPHOCYTES ABSOLUTE COUNT: 1.2 10*9/L (ref 0.7–4.0)
LYMPHOCYTES RELATIVE PERCENT: 4.9 %
MEAN CORPUSCULAR HEMOGLOBIN CONC: 33.2 g/dL (ref 30.0–36.0)
MEAN CORPUSCULAR VOLUME: 89.3 fL (ref 81.0–95.0)
MEAN PLATELET VOLUME: 6.8 fL — ABNORMAL LOW (ref 7.0–10.0)
MONOCYTES ABSOLUTE COUNT: 0.5 10*9/L (ref 0.1–1.0)
MONOCYTES RELATIVE PERCENT: 2.2 %
NEUTROPHILS RELATIVE PERCENT: 92.5 %
PLATELET COUNT: 102 10*9/L — ABNORMAL LOW (ref 150–450)
RED BLOOD CELL COUNT: 3.96 10*12/L — ABNORMAL LOW (ref 4.32–5.72)
RED CELL DISTRIBUTION WIDTH: 16.3 % — ABNORMAL HIGH (ref 12.0–15.0)
WBC ADJUSTED: 23.7 10*9/L — ABNORMAL HIGH (ref 3.5–10.5)

## 2019-07-21 LAB — COMPREHENSIVE METABOLIC PANEL
ALBUMIN: 4 g/dL (ref 3.4–5.0)
ALKALINE PHOSPHATASE: 90 U/L (ref 46–116)
ALT (SGPT): 29 U/L (ref 7–40)
ANION GAP: 7 mmol/L (ref 7–24)
AST (SGOT): 16 U/L (ref 13–40)
BILIRUBIN TOTAL: 1.4 mg/dL — ABNORMAL HIGH (ref 0.3–1.2)
BUN / CREAT RATIO: 19
CALCIUM: 8.8 mg/dL (ref 8.7–10.4)
CHLORIDE: 102 mmol/L (ref 98–107)
CO2: 31.1 mmol/L — ABNORMAL HIGH (ref 20.0–31.0)
CREATININE: 0.9 mg/dL (ref 0.60–1.10)
EGFR CKD-EPI AA MALE: >90 mL/min/{1.73_m2} (ref >=60)
EGFR CKD-EPI NON-AA MALE: >90 mL/min/{1.73_m2} (ref >=60)
GLUCOSE RANDOM: 112 mg/dL (ref 70–179)
POTASSIUM: 4.3 mmol/L (ref 3.5–5.1)
PROTEIN TOTAL: 7.3 g/dL (ref 5.7–8.2)
SODIUM: 140 mmol/L (ref 136–145)

## 2019-07-21 LAB — BASOPHILS RELATIVE PERCENT: Basophils/100 leukocytes:NFr:Pt:Bld:Qn:Automated count: 0

## 2019-07-21 LAB — EGFR CKD-EPI AA MALE: Glomerular filtration rate/1.73 sq M.predicted.black:ArVRat:Pt:Ser/Plas/Bld:Qn:Creatinine-based formula (CKD-EPI): >90

## 2019-07-21 LAB — CA 19-9: Cancer Ag 19-9:ACnc:Pt:Ser/Plas:Qn:: 7.32

## 2019-07-21 LAB — CARCINOEMBRYONIC ANTIGEN: Chemistry studies:Cmplx:-:^Patient:Set:: 3.1

## 2019-07-21 LAB — CA 125: Cell marker studies:Cmplx:-:^Patient:Set:: 8.3

## 2019-07-21 NOTE — Unmapped (Signed)
Diagnostic laparoscopy scheduled

## 2019-07-21 NOTE — Unmapped (Signed)
Patient Name: Mark Calhoun  Patient Age: 54 y.o.  Encounter Date: 07/21/2019    REFERRING PHYSICIAN:  Roni Bread, MD  [address]  [id number] PHYSICIANS OFFICE Mark Calhoun [place],  [place] [address]    CONSULTING PHYSICIANS:  Patient Care Team:  Mark Decant, MD as PCP - General  Mark Bread, MD (Oncology)  Mark Banda, RN as Registered Nurse    PRIMARY CARE PROVIDER:  Carlynn Herald, MD    DIAGNOSIS: Recurrent colon cancer    HISTORY OF PRESENT ILLNESS:    Mark Calhoun is a 54 y.o. male who is seen in consultation at the request of Mark Calhoun, Mark Caras, MD for Recurrent Colon Cancer. Otherwise relatively healthy, PMH includes gout and borderline HTN, no medications. He denies heart, lung, liver, or kidney disease, no DM.   Diagnosis 03/2018 stage III colon cancer presenting with severe anemia   03/17/18 lap left/sigmoid colectomy: pT3N1b (3/42) moderately differentiated adenocarcinoma, discontig tumor nodules present, intermediate tumor budding,  IHC intact for MMR enzymes (MLH1, MSH2, MSH6, PMS2). 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 He had a follow-up colonoscopy in June with a patent functional end-to-end colo-colonic anastomosis, characterized by erythema and friable mucosa biopsies with no malignant findings. Multiple polyps removed. He has a progressive elevation of CEA most recent was 21.6. He has 2 BM/day close to normal caliber in size, blood noted often. He endorses LLQ fullness and discomfort. No emesis, positive nausea, and decreased appetite on chemotherapy. No fevers, shaking chills or night sweats.     He presents with his wife via phone after receiving 6 cycles (last dose 07/16/19) of FOLFIRI to consider CRS and HIPEC in the treatment of his recurrent colon cancer. His CEA increased from 14.8 in October to 21.6 in November but has come down to 10.7 at the end of December.    Lesions were also noted in the liver and one in the lung; at time of recurrence, no new lesions were identified although increased spleen enlargement is noted as well as possible small esophageal varices. We discussed the concern for possible cirrhosis on imaging as well as labs with isolated hyperbilirubinemia, today's T bili 1.4, and platelets are 102.    We reviewed the CRS and HIPEC procedure as well as possible timing with earliest consideration of surgery to be now after 3 mos of systemic therapy. We reviewed the surgery to be palliative intent.   We reviewed moving forward with surgery in a staged format with a separate diagnostic laparoscopy scheduled sooner for evaluation earlier. This will determine if Dr. Selena Calhoun should proceed with CRS/HIPEC vs returning for further systemic therapy.  ??    REVIEW OF SYSTEMS:     Remainder of a 10 system review of systems is negative.     ALLERGIES:  has No Known Allergies.    MEDICATIONS: Reviewed in EPIC    MEDICAL HISTORY:  Past Medical History:   Diagnosis Date   ??? Colon cancer (CMS-HCC)    ??? Gout        SURGICAL HISTORY:  Past Surgical History:   Procedure Laterality Date   ??? IR INSERT PORT AGE GREATER THAN 5 YRS  04/27/2019    IR  INSERT PORT AGE GREATER THAN 5 YRS 04/27/2019 Mark Labrum, MD IMG [facility]   ??? PR COLONOSCOPY W/BIOPSY SINGLE/MULTIPLE N/A 03/04/2018    Procedure: COLONOSCOPY, FLEXIBLE, PROXIMAL TO SPLENIC FLEXURE; WITH BIOPSY, SINGLE OR MULTIPLE;  Surgeon: Mark Bras, MD;  Location: [facility] PROCED[facility];  Service: Gastroenterology   ??? PR COLONOSCOPY W/BIOPSY SINGLE/MULTIPLE  12/09/2018    Procedure: COLONOSCOPY, FLEXIBLE, PROXIMAL TO SPLENIC FLEXURE; WITH BIOPSY, SINGLE OR MULTIPLE;  Surgeon: Mark Bras, MD;  Location: [facility] PROCEDURES [facility];  Service: Gastroenterology   ??? PR COLSC FLX W/REMOVAL LESION BY HOT BX FORCEPS N/A 12/09/2018    Procedure: COLONOSCOPY, FLEXIBLE, PROXIMAL TO SPLENIC FLEXURE; W/REMOVAL TUMOR/POLYP/OTHER LESION, HOT BX FORCEP/CAUTE;  Surgeon: Mark Bras, MD;  Location: [facility] PROCEDURES [facility];  Service: Gastroenterology   ??? PR COLSC FLX W/RMVL OF TUMOR POLYP LESION SNARE TQ Left 03/04/2018    Procedure: COLONOSCOPY FLEX; W/REMOV TUMOR/LES BY SNARE;  Surgeon: Mark Bras, MD;  Location: [facility] PROC[facility];  Service: Gastroenterology   ??? PR COLSC FLX WITH DIRECTED SUBMUCOSAL NJX ANY SBST  03/04/2018    Procedure: COLONOSCOPY, FLEXIBLE, PROXIMAL TO SPLENIC FLEXURE; WITH DIRECTED SUBMUCOSAL INJECTION(S), ANY SUBSTANCE;  Surgeon: Mark Bras, MD;  Location: [facility] PROCEDURES [facility];  Service: Gastroenterology   ??? PR LAP,SURG,COLECTOMY,W/ANAST N/A 03/17/2018    Procedure: LAPAROSCOPY, SURGICAL; COLECTOMY, PARTIAL, WITH ANASTOMOSIS, WITH COLOPROCTOSTOMY (LOW PELVIC ANASTOMOSIS);  Surgeon: Mark Asper, MD;  Location: MAIN OR [facility];  Service: Gastrointestinal       SOCIAL HISTORY:   His wife Mark Calhoun.   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 Mark Calhoun has two teenage daughters (17 and 72 as of 03/2018).    reports that he has never smoked. He has never used smokeless tobacco. He reports that he does not drink alcohol.    FAMILY HISTORY:   family history includes Cancer in his maternal aunt; Cancer (age of onset: 70) in his father; Gout in his brother; Hyperlipidemia in his mother.    Objective :    Vital Signs for this encounter:  BSA: 2.09 meters squared  Temp 36.7 ??C (98.1 ??F) (Oral)  - Ht 180 cm (5' 10.87)  - Wt 87.5 kg (193 lb)  - BMI 27.02 kg/m??   Pain Assessment:  2 of 10     PHYSICAL ASSESSMENT:     General:  Alert.  Oriented x 3.  In NAD  HEENT:   PERRL. Sclerae anicteric.   Neck:       Supple; trachea midline.  No significant thyroid enlargement or nodules.  Heart:      RRR; no murmur  Lungs      Normal respiratory effort;  CTAB; no rhonchi or wheeze.  Abdomen:   Soft, slightly tender  With sense of fullness in LLQ.  No masses.   MSK:     Extremities without clubbing, cyanosis or edema.   5/5 strength. Ambulates wo assistance.  Neuro:   Nonfocal.  Sensation grossly intact.   Psych:   Normal affect.  Judgement and insight seem appropriate  Skin:     Skin color, texture, turgor normal, no rashes or lesions.??    DIAGNOSTIC STUDIES:  Results for MARIK, SEDORE (MRN [id number]) as of 07/21/2019 14:06   Ref. Range 11/06/2018 13:27 04/08/2019 15:41 05/06/2019 10:14 06/03/2019 13:53 06/30/2019 12:31   CEA Latest Ref Range: 0.0 - 5.0 ng/mL 2.0 14.8 (H)  21.6 (H) 15.7 (H) 10.7 (H)     12/09/18   Colonoscopy                                                                                 Impression:        - Multiple polyps in the terminal ileum. Biopsied.                     - One 5 mm polyp in the ascending colon, removed with a                      cold snare. Resected and retrieved.                     - One 5 mm polyp in the ascending colon, removed with a                      hot snare. Resected and retrieved.                     - Patent functional end-to-end colo-colonic anastomosis,                      characterized by erythema and friable mucosa.                     - Multiple 2 to 10 mm polyps in the sigmoid colon, removed                      with a hot snare. Resected and retrieved.                     - A tattoo was seen in the proximal rectum. The tattoo                      site appeared normal.                     - The examination was otherwise normal.  Final Diagnosis   A: Small bowel, ileum, biopsy  - No significant pathologic abnormality  ??  B: Colon, right, biopsy  - Adenomatous polyp (2 fragments)  - Focal hyperplastic changes (1 fragment)  - Single fragment of small bowel mucosa with prominent lymphoid nodule  ??  C: Colon, sigmoid, anastomosis, biopsy  - Ulceration with inflamed granulation tissue, consistent with anastomotic site  ??? No residual dysplasia or tumor identified     03/17/18  Final Diagnosis   A: Colon, sigmoid, partial colectomy  Invasive moderately differentiated adenocarcinoma (5.5 CM). Tumor invades through the muscularis propria and is 0.8 CM from the mesenteric margin. Tumor  is close to (1.25 mm) but does not penetrate the serosa. Lymphovascular and perineural invasion are noted. Three (3) of  Forty-two (42) lymph nodes  (parts A and B) are positive for tumor and soft tissue deposits are present as well.  A single hyperplastic polyp is also present  The area of attachment to the anterior abdominal wall is seen in slide A7. Tumor invades  fat but not the fascia or skeletal muscle  ??  B: Colon, additional proximal margin, resection  Colon negative for tumor. Three (3) lymph nodes negative for tumor.  ??     03/04/18  Colonoscopy  Impression:        - Likely malignant completely obstructing tumor in the                      distal transverse colon at 60cm from the anus. Biopsied.                      Tattooed.                     - One 10 mm polyp in the sigmoid colon at 20cm from the                      anus, removed with a hot snare. Resected and retrieved.                      Tattooed.                     - The examination was otherwise normal  Addendum   Specimen A: At the request of Dr. Elenore Rota, immunohistochemistry [facility]) Testing for Mismatch Repair (MMR) Proteins were performed on block A1 (splenic flexure mass). The results are summarized as follows:   ??  MLH1:  Intact nuclear expression  MSH2:  Intact nuclear expression  MSH6:  Intact nuclear expression  PMS2:  Intact nuclear expression  ??  Background non-neoplastic tissue/internal control with intact nuclear expression  ??  IHC Interpretation:  No loss of nuclear expression of MMR proteins: low probability of microsatellite instability-high (MSI-H)*  ??  *There are exceptions to the above IHC interpretations. These results should not be considered in isolation, and clinical correlation with genetic counseling is recommended to assess the need for germline testing.  ??  Materials will be submitted to the molecular pathology lab for microsatellite instability testing. These results will be reported in a separate report.  ??   Addendum electronically signed by Edwin Cap, MD on 03/09/2018 at 1018   Final Diagnosis   A: Colon, splenic flexure mass, biopsy  - Invasive moderately differentiated adenocarcinoma  ??  B: Colon, 20 mm polyp, biopsy  - Traditional serrated adenoma (1 fragment)  ??       IMAGING:  06/24/19 CT ab/pelv  IMPRESSION:  Similar appearance of multiple soft tissue mesenteric nodules along the left paracolic gutter and near the sigmoid colectomy resection site.  Marked splenomegaly, increased from prior.        06/24/19 Ct chest  IMPRESSION:  Unchanged 0.3 cm along the minor fissure compared to 04/15/2019.  Splenomegaly.  Prominent veins along the lower esophagus may represent small varices.    ?? 04/15/19 CT ab/pelv  FINDINGS:   LINES AND TUBES: None.  LOWER THORAX: Normal.  HEPATOBILIARY: Few small hypodense lesions are seen within the liver possibly cysts but too small to characterize. The largest measures 5 mm near the hepatic dome (1:29). No enhancing lesions. Gallbladder is present with concentrated bile or sludge. No biliary dilatation.    SPLEEN: Splenomegaly measures 15-16 cm  PANCREAS: Unremarkable.  ADRENALS: Unremarkable.  KIDNEYS/URETERS: Unremarkable.  BLADDER: Unremarkable.  PELVIC/REPRODUCTIVE ORGANS: Unremarkable.  GI TRACT: Operative findings of partial left sigmoid colectomy without complication along the resected margin. There is a  mesenteric enhancing nodule measuring 1.8 x 1.7 cm within the the antimesenteric border near the resected margin abutting the left psoas  with tethering concerning for local recurrence. Additional nodular focus along the posterior paracolic gutter measures 0.9 cm (1:104 and 5:52).   PERITONEUM/RETROPERITONEUM AND MESENTERY: No free air or fluid.  LYMPH NODES: No enlarged lymph nodes. Few retroperitoneal lymph nodes left para-aortic extending to the left common iliac lymph node basins measure up to 4 mm are not enlarged by size criteria. (1:94)  VESSELS: The aorta is normal in caliber.  No significant calcified atherosclerotic disease. The portal venous system is patent. The hepatic veins and IVC are unremarkable.  BONES AND SOFT TISSUES: Unremarkable.  ??  IMPRESSION:  Mesenteric masses are seen subjacent to the resected left sigmoid colectomy measuring 1.8 and 0.9 cm respectively concerning for local malignant implants.  Few hypodense lesions within the liver are too small to characterize possibly cyst. However attention on follow-up.  Splenomegaly.  Small hiatal hernia.    04/15/19 Ct chest  IMPRESSION:  ??  New nonspecific 0.2 cm right upper lobe nodule. Short-term follow-up in 3 months or per oncology protocol can be considered.        ASSESSMENT:   Emilo Gras is a 54 y.o. male with recurrent metastatic sigmoid colon cancer, elevated CEA, gout and thrombocytopenia with splenomegaly, isolated hyperbilirubinemia, and possible esophageal varices. He has continued to work on therapy.   Completed 6 cycles of FOLFIRI on 07/16/19 - dose reduced for thrombocytopenia.    PLAN:  ?? Diagnostic laparoscopy scheduled 07/30/19  ?? Colonoscopy prior to CRS/HIPEC  ?? Return to clinic at [facility] for surgical planning of CRS/HIPEC tentative date 08/20/19 or 08/27/19, ostomy marking, ERAS, etc. if indicated after diagnostic laparoscopy.    Our contact information was given for any further questions or concerns.    Dr. Roel Cluck was present for the evaluation and is in agreement with the plan above.    Gevena Barre, NP  Surgical Oncology

## 2019-07-21 NOTE — Unmapped (Signed)
Patient Name: Mark Calhoun  Patient Age: 54 y.o.  Encounter Date: 07/21/2019    REFERRING PHYSICIAN:  Roni Bread, Calhoun  [address]  [id number] PHYSICIANS OFFICE [place],  [place] [address]    CONSULTING PHYSICIANS:  Patient Care Team:  Mark Decant, Calhoun as PCP - General  Mark Bread, Calhoun (Oncology)  Mark Banda, RN as Registered Nurse    PRIMARY CARE PROVIDER:  Carlynn Herald, Calhoun    DIAGNOSIS: Recurrent colon cancer    HISTORY OF PRESENT ILLNESS:    Mark Calhoun is a 54 y.o. male who is seen in consultation at the request of Mark Calhoun for Recurrent Colon Cancer. Otherwise relatively healthy, PMH includes gout and borderline HTN, no medications. He denies heart, lung, liver, or kidney disease, no DM.   Diagnosis 03/2018 stage III colon cancer presenting with severe anemia   03/17/18 lap left/sigmoid colectomy: pT3N1b (3/42) moderately differentiated adenocarcinoma, discontig tumor nodules present, intermediate tumor budding,  IHC intact for MMR enzymes (MLH1, MSH2, MSH6, PMS2). 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 He had a follow-up colonoscopy in June with a patent functional end-to-end colo-colonic anastomosis, characterized by erythema and friable mucosa biopsies with no malignant findings. Multiple polyps removed. He has a progressive elevation of CEA most recent was 21.6. He has 2 BM/day close to normal caliber in size, blood noted often. He endorses LLQ fullness and discomfort. No emesis, positive nausea, and decreased appetite on chemotherapy. No fevers, shaking chills or night sweats.     He presents with his wife via phone after receiving 6 cycles (last dose 07/16/19) of FOLFIRI to consider CRS and HIPEC in the treatment of his recurrent colon cancer. His CEA increased from 14.8 in October to 21.6 in November but has come down to 10.7 at the end of December.    Lesions were also noted in the liver and one in the lung; at time of recurrence, no new lesions were identified although increased spleen enlargement is noted as well as possible small esophageal varices. We discussed the concern for possible cirrhosis on imaging as well as labs with isolated hyperbilirubinemia, today's T bili 1.4, and platelets are 102.    We reviewed the CRS and HIPEC procedure as well as possible timing with earliest consideration of surgery to be now after 3 mos of systemic therapy. We reviewed the surgery to be palliative intent.   We reviewed moving forward with surgery in a staged format with a separate diagnostic laparoscopy scheduled sooner for evaluation earlier. This will determine if Dr. Selena Calhoun should proceed with CRS/HIPEC vs returning for further systemic therapy.  ??    REVIEW OF SYSTEMS:     Remainder of a 10 system review of systems is negative.     ALLERGIES:  has No Known Allergies.    MEDICATIONS: Reviewed in EPIC    MEDICAL HISTORY:  Past Medical History:   Diagnosis Date   ??? Colon cancer (CMS-HCC)    ??? Gout        SURGICAL HISTORY:  Past Surgical History:   Procedure Laterality Date   ??? IR INSERT PORT AGE GREATER THAN 5 YRS  04/27/2019    IR  INSERT PORT AGE GREATER THAN 5 YRS 04/27/2019 Mark Calhoun IMG VIR [facility]   ??? PR COLONOSCOPY W/BIOPSY SINGLE/MULTIPLE N/A 03/04/2018    Procedure: COLONOSCOPY, FLEXIBLE, PROXIMAL TO SPLENIC FLEXURE; WITH BIOPSY, SINGLE OR MULTIPLE;  Surgeon: Mark Calhoun;  Location: [facility];  Service: Gastroenterology   ??? PR COLONOSCOPY W/BIOPSY SINGLE/MULTIPLE  12/09/2018    Procedure: COLONOSCOPY, FLEXIBLE, PROXIMAL TO SPLENIC FLEXURE; WITH BIOPSY, SINGLE OR MULTIPLE;  Surgeon: Mark Calhoun;  Location: [facility];  Service: Gastroenterology   ??? PR COLSC FLX W/REMOVAL LESION BY HOT BX FORCEPS N/A 12/09/2018    Procedure: COLONOSCOPY, FLEXIBLE, PROXIMAL TO SPLENIC FLEXURE; W/REMOVAL TUMOR/POLYP/OTHER LESION, HOT BX FORCEP/CAUTE;  Surgeon: Mark Calhoun;  Location: [facility] PROCEDURES [facility];  Service: Gastroenterology   ??? PR COLSC FLX W/RMVL OF TUMOR POLYP LESION SNARE TQ Left 03/04/2018    Procedure: COLONOSCOPY FLEX; W/REMOV TUMOR/LES BY SNARE;  Surgeon: Mark Calhoun;  Location: [facility];  Service: Gastroenterology   ??? PR COLSC FLX WITH DIRECTED SUBMUCOSAL NJX ANY SBST  03/04/2018    Procedure: COLONOSCOPY, FLEXIBLE, PROXIMAL TO SPLENIC FLEXURE; WITH DIRECTED SUBMUCOSAL INJECTION(S), ANY SUBSTANCE;  Surgeon: Mark Calhoun;  Location: [facility];  Service: Gastroenterology   ??? PR LAP,SURG,COLECTOMY,W/ANAST N/A 03/17/2018    Procedure: LAPAROSCOPY, SURGICAL; COLECTOMY, PARTIAL, WITH ANASTOMOSIS, WITH COLOPROCTOSTOMY (LOW PELVIC ANASTOMOSIS);  Surgeon: Mark Calhoun;  Location: MAIN OR [facility];  Service: Gastrointestinal       SOCIAL HISTORY:   His wife Mark Calhoun.   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 His brother Mark Calhoun is a Systems developer at [employer]. Mark Calhoun has two teenage daughters (17 and 72 as of 03/2018).    reports that he has never smoked. He has never used smokeless tobacco. He reports that he does not drink alcohol.    FAMILY HISTORY:   family history includes Cancer in his maternal aunt; Cancer (age of onset: 70) in his father; Gout in his brother; Hyperlipidemia in his mother.    Objective :    Vital Signs for this encounter:  BSA: 2.09 meters squared  Temp 36.7 ??C (98.1 ??F) (Oral)  - Ht 180 cm (5' 10.87)  - Wt 87.5 kg (193 lb)  - BMI 27.02 kg/m??   Pain Assessment:  2 of 10     PHYSICAL ASSESSMENT:     General:  Alert.  Oriented x 3.  In NAD  HEENT:   PERRL. Sclerae anicteric.   Neck:       Supple; trachea midline.  No significant thyroid enlargement or nodules.  Heart:      RRR; no murmur  Lungs      Normal respiratory effort;  CTAB; no rhonchi or wheeze.  Abdomen:   Soft, slightly tender  With sense of fullness in LLQ.  No masses.   MSK:     Extremities without clubbing, cyanosis or edema.   5/5 strength. Ambulates wo assistance.  Neuro:   Nonfocal.  Sensation grossly intact.   Psych:   Normal affect.  Judgement and insight seem appropriate  Skin:     Skin color, texture, turgor normal, no rashes or lesions.??    DIAGNOSTIC STUDIES:  Results for MARIK, SEDORE (MRN [id number]) as of 07/21/2019 14:06   Ref. Range 11/06/2018 13:27 04/08/2019 15:41 05/06/2019 10:14 06/03/2019 13:53 06/30/2019 12:31   CEA Latest Ref Range: 0.0 - 5.0 ng/mL 2.0 14.8 (H)  21.6 (H) 15.7 (H) 10.7 (H)     12/09/18   Colonoscopy                                                                                 Impression:        - Multiple polyps in the terminal ileum. Biopsied.                     - One 5 mm polyp in the ascending colon, removed with a                      cold snare. Resected and retrieved.                     - One 5 mm polyp in the ascending colon, removed with a                      hot snare. Resected and retrieved.                     - Patent functional end-to-end colo-colonic anastomosis,                      characterized by erythema and friable mucosa.                     - Multiple 2 to 10 mm polyps in the sigmoid colon, removed                      with a hot snare. Resected and retrieved.                     - A tattoo was seen in the proximal rectum. The tattoo                      site appeared normal.                     - The examination was otherwise normal.  Final Diagnosis   A: Small bowel, ileum, biopsy  - No significant pathologic abnormality  ??  B: Colon, right, biopsy  - Adenomatous polyp (2 fragments)  - Focal hyperplastic changes (1 fragment)  - Single fragment of small bowel mucosa with prominent lymphoid nodule  ??  C: Colon, sigmoid, anastomosis, biopsy  - Ulceration with inflamed granulation tissue, consistent with anastomotic site  ??? No residual dysplasia or tumor identified     03/17/18  Final Diagnosis   A: Colon, sigmoid, partial colectomy  Invasive moderately differentiated adenocarcinoma (5.5 CM). Tumor invades through the muscularis propria and is 0.8 CM from the mesenteric margin. Tumor  is close to (1.25 mm) but does not penetrate the serosa. Lymphovascular and perineural invasion are noted. Three (3) of  Forty-two (42) lymph nodes  (parts A and B) are positive for tumor and soft tissue deposits are present as well.  A single hyperplastic polyp is also present  The area of attachment to the anterior abdominal wall is seen in slide A7. Tumor invades  fat but not the fascia or skeletal muscle  ??  B: Colon, additional proximal margin, resection  Colon negative for tumor. Three (3) lymph nodes negative for tumor.  ??     03/04/18  Colonoscopy  Impression:        - Likely malignant completely obstructing tumor in the                      distal transverse colon at 60cm from the anus. Biopsied.                      Tattooed.                     - One 10 mm polyp in the sigmoid colon at 20cm from the                      anus, removed with a hot snare. Resected and retrieved.                      Tattooed.                     - The examination was otherwise normal  Addendum   Specimen A: At the request of Dr. Elenore Rota, immunohistochemistry [facility]) Testing for Mismatch Repair (MMR) Proteins were performed on block A1 (splenic flexure mass). The results are summarized as follows:   ??  MLH1:  Intact nuclear expression  MSH2:  Intact nuclear expression  MSH6:  Intact nuclear expression  PMS2:  Intact nuclear expression  ??  Background non-neoplastic tissue/internal control with intact nuclear expression  ??  IHC Interpretation:  No loss of nuclear expression of MMR proteins: low probability of microsatellite instability-high (MSI-H)*  ??  *There are exceptions to the above IHC interpretations. These results should not be considered in isolation, and clinical correlation with genetic counseling is recommended to assess the need for germline testing.  ??  Materials will be submitted to the molecular pathology lab for microsatellite instability testing. These results will be reported in a separate report.  ??   Addendum electronically signed by Edwin Cap, Calhoun on 03/09/2018 at 1018   Final Diagnosis   A: Colon, splenic flexure mass, biopsy  - Invasive moderately differentiated adenocarcinoma  ??  B: Colon, 20 mm polyp, biopsy  - Traditional serrated adenoma (1 fragment)  ??       IMAGING:  06/24/19 CT ab/pelv  IMPRESSION:  Similar appearance of multiple soft tissue mesenteric nodules along the left paracolic gutter and near the sigmoid colectomy resection site.  Marked splenomegaly, increased from prior.        06/24/19 Ct chest  IMPRESSION:  Unchanged 0.3 cm along the minor fissure compared to 04/15/2019.  Splenomegaly.  Prominent veins along the lower esophagus may represent small varices.    ?? 04/15/19 CT ab/pelv  FINDINGS:   LINES AND TUBES: None.  LOWER THORAX: Normal.  HEPATOBILIARY: Few small hypodense lesions are seen within the liver possibly cysts but too small to characterize. The largest measures 5 mm near the hepatic dome (1:29). No enhancing lesions. Gallbladder is present with concentrated bile or sludge. No biliary dilatation.    SPLEEN: Splenomegaly measures 15-16 cm  PANCREAS: Unremarkable.  ADRENALS: Unremarkable.  KIDNEYS/URETERS: Unremarkable.  BLADDER: Unremarkable.  PELVIC/REPRODUCTIVE ORGANS: Unremarkable.  GI TRACT: Operative findings of partial left sigmoid colectomy without complication along the resected margin. There is a  mesenteric enhancing nodule measuring 1.8 x 1.7 cm within the the antimesenteric border near the resected margin abutting the left psoas  with tethering concerning for local recurrence. Additional nodular focus along the posterior paracolic gutter measures 0.9 cm (1:104 and 5:52).   PERITONEUM/RETROPERITONEUM AND MESENTERY: No free air or fluid.  LYMPH NODES: No enlarged lymph nodes. Few retroperitoneal lymph nodes left para-aortic extending to the left common iliac lymph node basins measure up to 4 mm are not enlarged by size criteria. (1:94)  VESSELS: The aorta is normal in caliber.  No significant calcified atherosclerotic disease. The portal venous system is patent. The hepatic veins and IVC are unremarkable.  BONES AND SOFT TISSUES: Unremarkable.  ??  IMPRESSION:  Mesenteric masses are seen subjacent to the resected left sigmoid colectomy measuring 1.8 and 0.9 cm respectively concerning for local malignant implants.  Few hypodense lesions within the liver are too small to characterize possibly cyst. However attention on follow-up.  Splenomegaly.  Small hiatal hernia.    04/15/19 Ct chest  IMPRESSION:  ??  New nonspecific 0.2 cm right upper lobe nodule. Short-term follow-up in 3 months or per oncology protocol can be considered.        ASSESSMENT:   Emilo Gras is a 54 y.o. male with recurrent metastatic sigmoid colon cancer, elevated CEA, gout and thrombocytopenia with splenomegaly, isolated hyperbilirubinemia, and possible esophageal varices. He has continued to work on therapy.   Completed 6 cycles of FOLFIRI on 07/16/19 - dose reduced for thrombocytopenia.    PLAN:  ?? Diagnostic laparoscopy scheduled 07/30/19  ?? Colonoscopy prior to CRS/HIPEC  ?? Return to clinic at [facility] for surgical planning of CRS/HIPEC tentative date 08/20/19 or 08/27/19, ostomy marking, ERAS, etc. if indicated after diagnostic laparoscopy.    Our contact information was given for any further questions or concerns.    Dr. Roel Cluck was present for the evaluation and is in agreement with the plan above.    Gevena Barre, NP  Surgical Oncology

## 2019-07-28 ENCOUNTER — Encounter: Admit: 2019-07-28 | Discharge: 2019-07-29 | Payer: PRIVATE HEALTH INSURANCE | Attending: Family | Primary: Family

## 2019-07-28 NOTE — Unmapped (Signed)
Assessment     Mark Calhoun is a 54 y.o. male presenting to [facility] for COVID testing.     Problem List Items Addressed This Visit     None      Visit Diagnoses     Encounter for preprocedure screening laboratory testing for COVID-19    -  Primary    Relevant Orders    COVID-19 PCR          Plan     If no testing performed, pt counseled on routine care for respiratory illness.  If testing performed, COVID sent.  Patient directed to Home given findings during today's visit.    Subjective     Mark Calhoun is a 54 y.o. male who [facility] with complaints of the following:    Exposure History: In the last 21 days?     Have you traveled outside of [place]? No               Have you been in close contact with someone confirmed by a test to have COVID? 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    No         Are you scheduled to have surgery or a procedure in the next 3 days? Yes               Are you scheduled to receive cancer chemotherapy within the next 7 days?    No     Have you ever been tested before for COVID-19 with a swab of your nose? No   Are you a healthcare worker being tested so to return to work No         Right now,  do you have any of the following that developed over the past 7 days (as stated by patient on intake form):    Subjective fever (felt feverish) No   Chills (especially repeated shaking chills) No   Severe fatigue (felt very tired) No   Muscle aches No   Runny nose No   Sore throat No   Loss of taste or smell No   Cough (new onset or worsening of chronic cough) No   Shortness of breath No   Nausea or vomiting No   Headache No   Abdominal Pain No   Diarrhea (3 or more loose stools in last 24 hours) No     History/Medical Conditions (as stated by patient on intake form):    Do you have any of the following:   Asthma or emphysema or COPD No   Cystic Fibrosis No   Diabetes No   High Blood Pressure  No   Cardiovascular Disease No   Chronic Kidney Disease No   Chronic Liver Disease No   Chronic blood disorder like Sickle Cell Disease  No   Weak immune system due to disease or medication No   Neurologic condition that limits movement  No   Developmental delay - Moderate to Severe  No   Recent (within past 2 weeks) or current Pregnancy No   Morbid Obesity (>100 pounds over ideal weight) No   Current Smoker No   Former Smoker No       Objective  Given above, testing performed: Yes    Testing Performed:  Test Specimen Type Sent to   COVID-19  NP Swab [facility]       Scribe's Attestation:  Janae Bonser L. Lorin Picket, FNP, obtained and performed the history, physical exam and medical decision making elements that were entered into the chart.  Signed by Alleen Borne serving as Scribe, on 07/28/2019 3:13 PM      The documentation recorded by the scribe accurately reflects the service I personally performed and the decisions made by me. Aida Puffer, FNP  July 29, 2019 7:40 PM

## 2019-07-30 ENCOUNTER — Encounter: Admit: 2019-07-30 | Discharge: 2019-07-31 | Payer: PRIVATE HEALTH INSURANCE

## 2019-07-30 MED ORDER — OXYCODONE 5 MG TABLET
ORAL_TABLET | ORAL | 0 refills | 1.00000 days | Status: CP | PRN
Start: 2019-07-30 — End: 2019-08-04

## 2019-07-31 NOTE — Unmapped (Signed)
Priority LAPAROSCOPY, ABDOMEN, PERITONEUM, & OMENTUM, DIAGNOSTIC, W/WO COLLECTION SPECIMEN(S) BY BRUSHING OR WASHING  Operative Note (CSN: [id number])    Service    Date of Surgery: 07/30/2019  Admit Date: 07/30/2019  Performing Service: Surgical Oncology  Surgeon(s) and Role:     * Gayleen Orem, MD - Primary     * Irwin Brakeman, MD - Resident - Assisting      Operative Note    Pre-op Diagnosis: metastatic colon cancer    Post-op Diagnosis: Malignant neoplasm of sigmoid colon (CMS-HCC) [C18.7]    Note: Revisions to procedures should be made in chart - see Procedures tab.  Priority LAPAROSCOPY, ABDOMEN, PERITONEUM, & OMENTUM, DIAGNOSTIC, W/WO COLLECTION SPECIMEN(S) BY BRUSHING OR WASHING    Findings:   - diagnostic laparoscopy performed  - LLQ with known abdominal wall recurrence encased in omentum  - omentum adhered to anterior abdominal wall in LLQ and midline  - mesentery, omentum, antimesenteric small bowel w/o tumor deposits  - liver bluish, concern for developing cirrhosis  - small varices in omentum adjacent to stomach greater curve  - no peritoneal disease elsewhere in abdomen or on diaphragm  - small intestine moving freely w/ no retraction of mesentery    Anesthesia: General    Estimated Blood Loss: 2 mL    Complications: none    Specimens: None collected    Indications for Surgery:   54 year old male with a history of stage III colon cancer s/p lap left sigmoid colectomy on 03/17/2018 followed by adjuvant chemotherapy with concern for recurrence of his disease.  It is possible that his recurrence may be amenable to HIPEC so we will proceed w/ diagnostic laparoscopy to further evaluate his peritoneum to assess for resectability.    Procedure:  The patient  was brought into the operating room and placed in supine??position with both arms out. ??The patient underwent general endotracheal anesthesia. ??SCD boots were used throughout the case. ??Preoperative heparin [place] was administered.The abdomen was prepped and draped in the usual fashion. We began with a diagnostic laparoscopy. ??A stab incision was made with an 11 scalpel blade in the left upper quadrant and an optical view trocar with a 5mm zero degree laparoscopic camera used to enter the abdomen. ??Pneumoperitoneum was established to a pressure of 15mm Hg. ??A 30-degree laparoscopic camera was inserted and the abdomen explored.     We started by inspecting bilateral diaphragms, and there was no evidence of disease.  The liver had a bluish hue and there was some concern for possible early cirrhosis, but there is no evidence of metastatic disease in the liver.  There is also no evidence of metastatic disease in the falciform ligament.  The spleen also appeared slightly enlarged but we did not see any obvious evidence of metastatic disease in the spleen.  The omentum near the stomach along the greater curvature did contain some varices.  The gallbladder appeared normal.  There did not appear to be any metastatic disease in the pelvis, or the generalized peritoneum.  There was no evidence of mesenteric foreshortening or retraction, as well as no evidence of small bowel deposits.  The small bowel moved freely.  There was some omentum that was tacked up to the left lower quadrant anterior peritoneum as well as some: Which was in the area of his known recurrence.  We were unable to biopsy any of the tissue because the tumor appeared to be encased in omentum and we did not want to cause any additional  bleeding.  There is no sign of bowel obstruction.    The patient had no evidence of ascites either.  Hemostasis was assured.  Both port sites were closed with 4-0 Monocryl.   Dermabond was applied to all sites. Patient was extubated, awakened and taken to PACU in stable condition.     Surgeon Notes: I was present and scrubbed for the entire procedure    Irwin Brakeman   Date: 07/30/2019  Time: 8:39 PM

## 2019-08-03 NOTE — Unmapped (Signed)
Hi,     Jamar Weatherall contacted the [employer] requesting to speak with the care team of Mark Calhoun to discuss:    Mark Calhoun wanted to follow up on a discussion about getting a colonoscopy prior to 08/17/19 surgery he had with Dr. Meredith Mody.    Please contact Mark Calhoun at [phone].    Thank you,   Kelli Hope  [facility]   [phone]

## 2019-08-03 NOTE — Unmapped (Signed)
This patient has been disenrolled from the [facility] specialty pharmacy services due to therapy completion - expected therapy completion date: 07/16/19.    Saranya Harlin Vangie Bicker  [facility] Specialty Pharmacist

## 2019-08-03 NOTE — Unmapped (Signed)
Mark Calhoun contacted the [employer] regarding the following:    - Returning your call.    Please contact at [phone].    Thanks in advance,    Mark Calhoun  [facility]   [phone]

## 2019-08-04 DIAGNOSIS — C187 Malignant neoplasm of sigmoid colon: Principal | ICD-10-CM

## 2019-08-10 ENCOUNTER — Encounter: Admit: 2019-08-10 | Discharge: 2019-08-11 | Payer: PRIVATE HEALTH INSURANCE | Attending: Family | Primary: Family

## 2019-08-11 ENCOUNTER — Ambulatory Visit: Admit: 2019-08-11 | Discharge: 2019-08-11 | Payer: PRIVATE HEALTH INSURANCE | Attending: Surgery | Primary: Surgery

## 2019-08-11 DIAGNOSIS — C187 Malignant neoplasm of sigmoid colon: Principal | ICD-10-CM

## 2019-08-11 MED ORDER — POLYETHYLENE GLYCOL 3350 17 GRAM ORAL POWDER PACKET
PACK | Freq: Once | ORAL | 0 refills | 1 days | Status: CP
Start: 2019-08-11 — End: 2019-08-11

## 2019-08-11 MED ORDER — NEOMYCIN 500 MG TABLET
ORAL_TABLET | Freq: Three times a day (TID) | ORAL | 0 refills | 1.00000 days | Status: CP
Start: 2019-08-11 — End: 2019-08-12

## 2019-08-11 MED ORDER — BISACODYL 5 MG TABLET,DELAYED RELEASE
ORAL_TABLET | Freq: Once | ORAL | 0 refills | 1 days | Status: CP
Start: 2019-08-11 — End: 2019-08-11

## 2019-08-12 ENCOUNTER — Encounter
Admit: 2019-08-12 | Discharge: 2019-08-12 | Payer: PRIVATE HEALTH INSURANCE | Attending: Anesthesiology | Primary: Anesthesiology

## 2019-08-12 ENCOUNTER — Encounter: Admit: 2019-08-12 | Discharge: 2019-08-12 | Payer: PRIVATE HEALTH INSURANCE

## 2019-08-14 ENCOUNTER — Encounter
Admit: 2019-08-14 | Discharge: 2019-08-15 | Payer: PRIVATE HEALTH INSURANCE | Attending: Nurse Practitioner | Primary: Nurse Practitioner

## 2019-08-17 ENCOUNTER — Ambulatory Visit
Admit: 2019-08-17 | Discharge: 2019-09-02 | Disposition: A | Discharge status: Home / Self Care | Payer: PRIVATE HEALTH INSURANCE | Admitting: Surgery

## 2019-08-17 ENCOUNTER — Encounter
Admit: 2019-08-17 | Discharge: 2019-09-02 | Disposition: A | Discharge status: Home / Self Care | Payer: PRIVATE HEALTH INSURANCE | Attending: Student in an Organized Health Care Education/Training Program | Admitting: Surgery

## 2019-09-02 MED ORDER — PSYLLIUM HUSK (ASPARTAME) 3.4 GRAM ORAL POWDER PACKET
PACK | Freq: Three times a day (TID) | ORAL | 0 refills | 30.00 days | Status: CP
Start: 2019-09-02 — End: 2019-10-02

## 2019-09-02 MED ORDER — ENOXAPARIN 40 MG/0.4 ML SUBCUTANEOUS SYRINGE
SUBCUTANEOUS | 0 refills | 30.00 days | Status: CP
Start: 2019-09-02 — End: 2019-10-02
  Filled 2019-09-02: qty 12, 30d supply, fill #0

## 2019-09-02 MED ORDER — DIPHENOXYLATE-ATROPINE 2.5 MG-0.025 MG TABLET
ORAL_TABLET | Freq: Four times a day (QID) | ORAL | 0 refills | 30.00 days | Status: CP
Start: 2019-09-02 — End: 2019-10-02
  Filled 2019-09-02: qty 240, 30d supply, fill #0

## 2019-09-02 MED ORDER — OPIUM TINCTURE 10 MG/ML (MORPHINE) ORAL
Freq: Four times a day (QID) | ORAL | 0 refills | 30.00 days | Status: CP
Start: 2019-09-02 — End: 2019-10-02
  Filled 2019-09-02: qty 28, 7d supply, fill #0

## 2019-09-02 MED ORDER — PANTOPRAZOLE 40 MG TABLET,DELAYED RELEASE
ORAL_TABLET | Freq: Two times a day (BID) | ORAL | 0 refills | 30.00000 days | Status: CP
Start: 2019-09-02 — End: 2019-10-02
  Filled 2019-09-02: qty 60, 30d supply, fill #0

## 2019-09-02 MED ORDER — SIMETHICONE 80 MG CHEWABLE TABLET
ORAL_TABLET | Freq: Four times a day (QID) | ORAL | 0 refills | 13 days | Status: CP | PRN
Start: 2019-09-02 — End: 2019-10-02
  Filled 2019-09-02: qty 24, 3d supply, fill #0

## 2019-09-02 MED ORDER — SUCRALFATE 1 GRAM TABLET
ORAL_TABLET | Freq: Four times a day (QID) | ORAL | 0 refills | 7.00000 days | Status: CP
Start: 2019-09-02 — End: 2019-09-09
  Filled 2019-09-02: qty 28, 7d supply, fill #0

## 2019-09-02 MED ORDER — LOPERAMIDE 2 MG TABLET
ORAL_TABLET | Freq: Four times a day (QID) | ORAL | 0 refills | 30.00 days | Status: CP
Start: 2019-09-02 — End: 2019-10-02

## 2019-09-02 MED ORDER — ACETAMINOPHEN 325 MG TABLET
ORAL_TABLET | Freq: Four times a day (QID) | ORAL | 0 refills | 4.00000 days | Status: CP | PRN
Start: 2019-09-02 — End: ?
  Filled 2019-09-02: qty 30, 4d supply, fill #0

## 2019-09-02 MED FILL — ANTI-DIARRHEAL (LOPERAMIDE) 2 MG TABLET: 3 days supply | Qty: 24 | Fill #0 | Status: AC

## 2019-09-02 MED FILL — OPIUM TINCTURE 10 MG/ML (MORPHINE) ORAL: 7 days supply | Qty: 28 | Fill #0 | Status: AC

## 2019-09-02 MED FILL — MI-ACID GAS RELIEF (SIMETHICONE) 80 MG CHEWABLE TABLET: ORAL | 13 days supply | Qty: 100 | Fill #0

## 2019-09-02 MED FILL — SUCRALFATE 1 GRAM TABLET: 7 days supply | Qty: 28 | Fill #0 | Status: AC

## 2019-09-02 MED FILL — ACETAMINOPHEN 325 MG TABLET: 4 days supply | Qty: 30 | Fill #0 | Status: AC

## 2019-09-02 MED FILL — DIPHENOXYLATE-ATROPINE 2.5 MG-0.025 MG TABLET: 30 days supply | Qty: 240 | Fill #0 | Status: AC

## 2019-09-02 MED FILL — PANTOPRAZOLE 40 MG TABLET,DELAYED RELEASE: 30 days supply | Qty: 60 | Fill #0 | Status: AC

## 2019-09-02 MED FILL — MI-ACID GAS RELIEF (SIMETHICONE) 80 MG CHEWABLE TABLET: 13 days supply | Qty: 100 | Fill #0 | Status: AC

## 2019-09-02 MED FILL — ENOXAPARIN 40 MG/0.4 ML SUBCUTANEOUS SYRINGE: 30 days supply | Qty: 12 | Fill #0 | Status: AC

## 2019-09-06 ENCOUNTER — Encounter: Admit: 2019-09-06 | Discharge: 2019-09-07 | Payer: PRIVATE HEALTH INSURANCE

## 2019-09-06 DIAGNOSIS — C187 Malignant neoplasm of sigmoid colon: Principal | ICD-10-CM

## 2019-09-06 DIAGNOSIS — R6889 Other general symptoms and signs: Principal | ICD-10-CM

## 2019-09-08 ENCOUNTER — Encounter: Admit: 2019-09-08 | Discharge: 2019-09-09 | Payer: PRIVATE HEALTH INSURANCE | Attending: Surgery | Primary: Surgery

## 2019-09-08 DIAGNOSIS — C187 Malignant neoplasm of sigmoid colon: Principal | ICD-10-CM

## 2019-09-08 DIAGNOSIS — Z932 Ileostomy status: Principal | ICD-10-CM

## 2019-09-08 MED ORDER — OPIUM TINCTURE 10 MG/ML (MORPHINE) ORAL
Freq: Four times a day (QID) | ORAL | 0 refills | 30.00000 days | Status: CP
Start: 2019-09-08 — End: 2019-10-08
  Filled 2019-09-08: qty 28, 7d supply, fill #1

## 2019-09-08 MED FILL — OPIUM TINCTURE 10 MG/ML (MORPHINE) ORAL: 7 days supply | Qty: 28 | Fill #1 | Status: AC

## 2019-09-13 ENCOUNTER — Encounter: Admit: 2019-09-13 | Discharge: 2019-09-14 | Payer: PRIVATE HEALTH INSURANCE

## 2019-09-13 DIAGNOSIS — C187 Malignant neoplasm of sigmoid colon: Principal | ICD-10-CM

## 2019-09-13 DIAGNOSIS — R110 Nausea: Principal | ICD-10-CM

## 2019-09-13 MED ORDER — ONDANSETRON 4 MG DISINTEGRATING TABLET
ORAL_TABLET | Freq: Three times a day (TID) | ORAL | 0 refills | 7 days | Status: CP | PRN
Start: 2019-09-13 — End: 2019-09-20

## 2019-09-15 ENCOUNTER — Ambulatory Visit
Admit: 2019-09-15 | Discharge: 2019-09-27 | Disposition: A | Discharge status: Home / Self Care | Payer: PRIVATE HEALTH INSURANCE | Attending: Surgery | Admitting: Surgery

## 2019-09-15 ENCOUNTER — Ambulatory Visit
Admit: 2019-09-15 | Discharge: 2019-09-27 | Disposition: A | Discharge status: Home / Self Care | Payer: PRIVATE HEALTH INSURANCE | Admitting: Surgery

## 2019-09-15 DIAGNOSIS — C187 Malignant neoplasm of sigmoid colon: Principal | ICD-10-CM

## 2019-09-15 DIAGNOSIS — E860 Dehydration: Principal | ICD-10-CM

## 2019-09-21 DIAGNOSIS — C187 Malignant neoplasm of sigmoid colon: Principal | ICD-10-CM

## 2019-09-27 MED ORDER — PSYLLIUM HUSK (ASPARTAME) 3.4 GRAM ORAL POWDER PACKET
PACK | Freq: Three times a day (TID) | ORAL | 0 refills | 30.00000 days
Start: 2019-09-27 — End: 2019-10-27

## 2019-09-27 MED ORDER — SODIUM CHLORIDE 0.9 % (FLUSH) INJECTION SYRINGE
Freq: Two times a day (BID) | 0 refills | 14.00000 days | Status: CP
Start: 2019-09-27 — End: 2019-09-27

## 2019-09-29 ENCOUNTER — Other Ambulatory Visit: Admit: 2019-09-29 | Discharge: 2019-09-30 | Payer: PRIVATE HEALTH INSURANCE

## 2019-09-30 MED ORDER — MIRTAZAPINE 15 MG TABLET
ORAL_TABLET | Freq: Every evening | ORAL | 11 refills | 30 days | Status: CP
Start: 2019-09-30 — End: 2020-09-24

## 2019-10-06 ENCOUNTER — Encounter: Admit: 2019-10-06 | Discharge: 2019-10-06 | Payer: PRIVATE HEALTH INSURANCE | Attending: Surgery | Primary: Surgery

## 2019-10-06 DIAGNOSIS — C187 Malignant neoplasm of sigmoid colon: Principal | ICD-10-CM

## 2019-10-13 DIAGNOSIS — F4321 Adjustment disorder with depressed mood: Principal | ICD-10-CM

## 2019-10-18 MED ORDER — AMOXICILLIN 500 MG CAPSULE
0 days
Start: 2019-10-18 — End: ?

## 2019-10-23 ENCOUNTER — Encounter: Admit: 2019-10-23 | Discharge: 2019-10-24 | Payer: PRIVATE HEALTH INSURANCE

## 2019-10-25 DIAGNOSIS — C187 Malignant neoplasm of sigmoid colon: Principal | ICD-10-CM

## 2019-10-26 ENCOUNTER — Ambulatory Visit
Admit: 2019-10-26 | Discharge: 2019-10-30 | Disposition: A | Discharge status: Home / Self Care | Payer: PRIVATE HEALTH INSURANCE | Admitting: Surgery

## 2019-10-26 ENCOUNTER — Encounter
Admit: 2019-10-26 | Discharge: 2019-10-30 | Disposition: A | Discharge status: Home / Self Care | Payer: PRIVATE HEALTH INSURANCE | Admitting: Surgery

## 2019-10-30 MED ORDER — OXYCODONE 5 MG TABLET
ORAL_TABLET | ORAL | 0 refills | 2.00 days | Status: CP | PRN
Start: 2019-10-30 — End: ?
  Filled 2019-10-30: qty 10, 2d supply, fill #0

## 2019-10-30 MED FILL — OXYCODONE 5 MG TABLET: 2 days supply | Qty: 10 | Fill #0 | Status: AC

## 2019-11-01 ENCOUNTER — Ambulatory Visit
Admit: 2019-11-01 | Discharge: 2019-11-29 | Payer: PRIVATE HEALTH INSURANCE | Attending: Radiation Oncology | Primary: Radiation Oncology

## 2019-11-01 ENCOUNTER — Ambulatory Visit: Admit: 2019-11-17 | Discharge: 2019-11-18 | Payer: PRIVATE HEALTH INSURANCE

## 2019-11-01 ENCOUNTER — Ambulatory Visit: Admit: 2019-11-01 | Discharge: 2019-11-29 | Payer: PRIVATE HEALTH INSURANCE

## 2019-11-01 ENCOUNTER — Encounter: Admit: 2019-11-01 | Discharge: 2019-11-29 | Payer: PRIVATE HEALTH INSURANCE

## 2019-11-01 ENCOUNTER — Encounter
Admit: 2019-11-01 | Discharge: 2019-11-29 | Payer: PRIVATE HEALTH INSURANCE | Attending: Radiation Oncology | Primary: Radiation Oncology

## 2019-11-05 ENCOUNTER — Encounter: Admit: 2019-11-05 | Discharge: 2019-11-06 | Payer: PRIVATE HEALTH INSURANCE

## 2019-11-05 DIAGNOSIS — F4321 Adjustment disorder with depressed mood: Principal | ICD-10-CM

## 2019-11-10 ENCOUNTER — Encounter: Admit: 2019-11-10 | Discharge: 2019-11-11 | Payer: PRIVATE HEALTH INSURANCE

## 2019-11-10 ENCOUNTER — Encounter: Admit: 2019-11-10 | Discharge: 2019-11-11 | Payer: PRIVATE HEALTH INSURANCE | Attending: Surgery | Primary: Surgery

## 2019-11-10 DIAGNOSIS — C187 Malignant neoplasm of sigmoid colon: Principal | ICD-10-CM

## 2019-11-10 NOTE — Unmapped (Signed)
Patient Name: Mark Calhoun  Patient Age: 54 y.o.  Encounter Date: 11/10/2019    Attending Physician:  Dr. Meredith Mody    Primary Care Provider:  Carlynn Herald, MD    CONSULTING PHYSICIANS:  Patient Care Team:  Al Decant, MD as PCP - General  Roni Bread, MD (Oncology)  Alesia Banda, RN as Registered Nurse  Berenice Bouton Anastasio Auerbach, RN as Registered Nurse (Oncology Navigator)  Gayleen Orem, MD as Attending Provider (Surgical Oncology)  Ward Givens, PMHNP as Nurse Practitioner (Psychiatry)    Diagnosis:  Stage pT3N1b sigmoid cancer dx 03/2018    Follow Up Note:  54 y.o. M w/ stage pT3N1b sigmoid cancer dx 03/2018 with sigmoid colectomy at that time. Now s/p primary tx w/ CapeOX and subsequent recurrence tx'd w/ FOLFIRI from 05/2019-07/2019. He subsequently under went resection of prior anastomosis w/ abdominal side wall en bloc as well as omtx, appendectomy, and HIPEC w/ mitomycin-C. He had a protective ileostomy performed at that time. Subsequently he underwent ileostomy reversal on 10/26/2019.     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 Reports that he is eating well and is able to walk 0.5 mi's twice per day. He passes one loose stool daily. No nausea/vomiting/fevers or chills. He reports intermittent low intensity RLQ pain. He ahs an appt w/ med onc and rad onc tomorrow. Last CEA and imaging in March of this year    Vital Signs for this encounter:  BSA: There is no height or weight on file to calculate BSA.  There were no vitals taken for this visit.  Wt Readings from Last 3 Encounters:   10/27/19 69.6 kg (153 lb 7 oz)   10/06/19 69.7 kg (153 lb 9.6 oz)   09/17/19 72.3 kg (159 lb 8 oz)       General Appearance:  No acute distress, well appearing and well nourished. A&0 x 3.   Eyes:  Conjuctiva and lids appear normal. Pupils equal and round, sclera anicteric.   Pulmonary:    Normal respiratory effort.   Cardiovascular:  Regular rate and rhythm.    Abdomen:   Soft, non-tender, without masses.  No hepatosplenomegaly. No hernias appreciated. Normoactive bowel sounds.   Musculoskeletal: Normal gait.  Extremities without clubbing, cyanosis, or edema.   Neurologic:  Lymphatic: No motor abnormalities noted.  Sensation grossly intact.  No cervical or supraclavicular LAD noted.     Diagnostic Studies:      Assessment: 54 y.o. yo M w/  stage pT3N1b sigmoid cancer dx 03/2018 with sigmoid colectomy at that time. Now s/p primary tx w/ CapeOX and subsequent recurrence tx'd w/ FOLFIRI from 05/2019-07/2019. He subsequently under went resection of prior anastomosis w/ abdominal side wall en bloc as well as omtx, appendectomy, and HIPEC w/ mitomycin-C. He had a protective ileostomy performed at that time. Subsequently he underwent ileostomy reversal on 10/26/2019.     He is doing well from a postoperative perspective and recovering as expected.    Plan:  Treatment planning: Visit w/ rad onc and med onc tomorrow. Plan surveillance CT tomorrow at 0800 (chest/abd/pelvis w/ contrast). CEA today.    Will plan to see patient q3 months alternating between surgonc and med onc given high risk disease. Will obtain imaging and CEA/CA125/CA199 q3 months. CMP today. Will need repeat colonoscopy 08/2020 given his disease recurrence in her prior anastomosis    Our contact information was given for any further questions or concerns.    Dr. Meredith Mody was present for the evaluation  and is in agreement with the plan above.

## 2019-11-11 ENCOUNTER — Encounter: Admit: 2019-11-11 | Discharge: 2019-11-11 | Payer: PRIVATE HEALTH INSURANCE

## 2019-11-11 ENCOUNTER — Encounter
Admit: 2019-11-11 | Discharge: 2019-11-11 | Payer: PRIVATE HEALTH INSURANCE | Attending: Hematology & Oncology | Primary: Hematology & Oncology

## 2019-11-11 DIAGNOSIS — C187 Malignant neoplasm of sigmoid colon: Principal | ICD-10-CM

## 2019-11-11 DIAGNOSIS — C787 Secondary malignant neoplasm of liver and intrahepatic bile duct: Principal | ICD-10-CM

## 2019-11-11 MED ORDER — CAPECITABINE 500 MG TABLET
ORAL_TABLET | Freq: Two times a day (BID) | ORAL | 0 refills | 28.00000 days | Status: CP
Start: 2019-11-11 — End: ?
  Filled 2019-11-18: qty 120, 28d supply, fill #0

## 2019-11-12 NOTE — Unmapped (Signed)
[facility]   Patient Onboarding/Medication Counseling    The patient declined counseling on missed dose instructions, goals of therapy, warnings and precautions, drug/food interactions and storage, handling precautions, and disposal because He has taken before.  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  . The information in the declined sections below are for informational purposes only and was not discussed with patient.     Mark Calhoun is a 54 y.o. male with Malignant neoplasm of the sigmoid colon who I am counseling today on re-initiation of therapy.  I am speaking to the patient.    Was a Nurse, learning disability used for this call? No    Verified patient's date of birth / HIPAA.    Specialty medication(s) to be sent: Hematology/Oncology: Capecitabine 500mg , directions: Take 3 tablets (1,500 mg total) by mouth Two (2) times a day on radiation days (Monday-Friday).    Non-specialty medications/supplies to be sent: none      Medications not needed at this time: none       Xeloda (capecitabine)    Medication & Administration     Dosage: Take 3 tablets (1,500 mg total) by mouth Two (2) times a day on radiation days (Monday-Friday).    Administration: I reviewed the importance of taking with a full glass of water within 30 minutes of a meal (at least 1 cup of food). Discussed that tablet should be swallowed whole and cannot be crushed or chewed.    Adherence/Missed dose instruction: If a dose is missed, do not take an extra dose or two doses at one time. Simply take your next dose at the regularly scheduled time and record any missed doses so our team is aware.    Goals of Therapy     Prevent disease progression    Side Effects & Monitoring Parameters     ??? Nausea/vomiting  ??? Diarrhea/constipation  ??? Infection precautions  ??? Fatigue  ??? Mouth sores or irritation  ??? Hand/foot syndrome (tingling, numbness, pain, redness, peeling or blistering) Use Urea 20% cream, Aquaphor, Eucerin, Cetaphil, or CeraVe twice daily on hands and feet as preventative  ??? Sun precautions  ??? Decrease appetite/ taste changes  ??? Bleeding precautions (bruising easily, nose bleeds, gums bleed)  ??? Headache  ??? Dry skin  ??? Hair loss    The following side effects should be reported to the provider:  ??? Diarrhea not controlled by anti-diarrheals  ??? Swelling, warmth, numbness, change of color or pain in a leg or arm  ??? Signs of infection (fever >100.4, chills, sore throat, sputum production)  ??? Signs of liver problems (dark urine, abdominal pain, light-colored stools, vomiting, yellow skin or eyes)  ??? Signs of bleeding (vomiting or coughing up blood, blood that looks like coffee grounds, blood in the urine or black, red tarry stools, bruising that gets bigger without reason, any persistent or severe bleeding)  ??? Skin rash or signs of skin infection (cracking, peeling, blistering, bleeding skin, red or irritated eyes)  ??? Signs of fluid and electrolyte problems (mood changes, confusion, abnormal/fast  heartbeat, severe dizziness, passing out, increased thirst, seizures, loss of strength and energy, lack of appetite, unable to pass urine or change in amount of urine passed, dry mouth, dry eyes, or nausea or vomiting.  ??? Signs of hand foot syndrome (redness or irritation on the palms of the hands or soles of the feet)  ??? Signs of mucositis (red or swollen mouth/gums, sores in the mouth, gums or tongue, soreness or  pain in the mouth or throat, difficulty swallowing or talking, dryness, mild burning, or pain when eating food white patches or pus in the mouth or on the tongue, increased mucus or thicker saliva)  ??? Signs of anaphylaxis (wheezing, chest tightness, swelling of face, lips, tongue or throat)    Monitoring parameters: Renal function should be estimated at baseline to determine initial dose. During therapy, CBC with differential, hepatic function, and renal function should be monitored. Monitor INR closely if receiving concomitant warfarin. Pregnancy test prior to treatment initiation (in females of reproductive potential). Monitor for diarrhea, dehydration, hand-foot syndrome, Stevens-Johnson syndrome, toxic epidermal necrolysis, stomatitis, and cardiotoxicity. Monitor adherence.    Contraindications, Warnings & Precautions     [place] Boxed Warning: Capecitabine may increase the anticoagulant effects of warfarin; bleeding events, including death, have occurred with concomitant use. Clinically significant increases in prothrombin time (PT) and INR have occurred within several days to months after capecitabine initiation (in patients previously stabilized on anticoagulants), and may continue up to 1 month after capecitabine discontinuation. May occur in patients with or without liver metastases. Monitor PT and INR frequently and adjust anticoagulation dosing accordingly. An increased risk of coagulopathy is correlated with a cancer diagnosis and age >60 years.    Contraindications:  ??? Known hypersensitivity to capecitabine, fluorouracil, or any component of the formulation;   ??? Severe renal impairment (CrCl <30 mL/minute)    Warnings and Precautions:  ??? Bone marrow suppression  ??? Cardiotoxicity: Myocardial infarction, ischemia, angina, dysrhythmias, cardiac arrest, cardiac failure, sudden death, ECG changes, and cardiomyopathy  ??? Mouth sores-discussed use of baking soda/salt water rinses  ??? Dermatologic toxicity: Stevens-Johnson syndrome and toxic epidermal necrolysis   ??? Hand/foot prevention (moisturizers, luke warm hand washes, patting hands/feet dry, decrease rubbing on hands/feet)  ??? GI toxicity: May cause diarrhea (may be severe); Importance of good nutrition to help minimize diarrhea (high protein, BRAT, yogurt, avoid greasy and spicy foods).  Importance of hydration if having frequent diarrhea  ??? Reproductive concerns: Evaluate pregnancy status prior to therapy in females of reproductive potential. Females of reproductive potential should use effective contraception during treatment and for 6 months after the last dose. Males with male partners of reproductive potential should use effective contraception during treatment and for 3 months after the last dose. Based on the mechanism of action and data from animal reproduction studies, in utero exposure to capecitabine may cause fetal harm.   ??? Breast feeding considerations: It is not known if capecitabine is present in breast milk. Due to the potential for serious adverse reactions in the breastfed infant, breastfeeding is not recommended by the manufacturer during treatment and for 2 weeks after the last dose.    Drug/Food Interactions     ??? Medication list reviewed in Epic. The patient was instructed to inform the care team before taking any new medications or supplements. No drug interactions identified.   ??? Avoid live vaccines.    Storage, Handling Precautions, & Disposal     ??? This medication should be stored at room temperature and in a dry location. Keep out of reach of others including children and pets. Keep the medicine in the original container with a child-proof top (no pillboxes). Do not throw away or flush unused medication down the toilet or sink. This drug is considered hazardous and should be handled as little as possible.  If someone else helps with medication administration, they should wear gloves.    Current Medications (including OTC/herbals), Comorbidities and Allergies  Current Outpatient Medications   Medication Sig Dispense Refill   ??? acetaminophen (TYLENOL) 325 MG tablet Take 2 tablets (650 mg total) by mouth every six (6) hours as needed. 30 tablet 0   ??? capecitabine (XELODA) 500 MG tablet Take 3 tablets (1,500 mg total) by mouth Two (2) times a day on radiation days (Monday-Friday). 168 tablet 0   ??? cholecalciferol, vitamin D3, (VITAMIN D3) 5,000 unit tablet Take 5,000 Units by mouth daily.     ??? colchicine (MITIGARE) 0.6 mg cap capsule Take as needed for gout flares. Take 1.2 mg at the first sign of flare, followed in 1 hour with a single dose of 0.6. Then take 0.6 mg twice daily until flare resolves 30 capsule 2   ??? multivitamin (TAB-A-VITE/THERAGRAN) per tablet Take 1 tablet by mouth daily.       No current facility-administered medications for this visit.       No Known Allergies    Patient Active Problem List   Diagnosis   ??? History of gout   ??? Colon cancer (CMS-HCC)   ??? Iron deficiency anemia due to chronic blood loss   ??? Malignant neoplasm of sigmoid colon (CMS-HCC)   ??? Dehydration   ??? Adjustment disorder with depressed mood       Reviewed and up to date in Epic.    Appropriateness of Therapy     Is medication and dose appropriate based on diagnosis? Yes    Prescription has been clinically reviewed: Yes    Baseline Quality of Life Assessment      How many days over the past month did your malignant neoplasm of sigmoid colon  keep you from your normal activities? For example, brushing your teeth or getting up in the morning. 0    Financial Information     Medication Assistance provided: None Required    Anticipated copay of $0 / 28 reviewed with patient. Verified delivery address.    Delivery Information     Scheduled delivery date: 11/19/19    Expected start date: TBD - possible week of 05/24    Medication will be delivered via UPS to the prescription address in [facility].  This shipment will not require a signature.      Explained the services we provide at [facility] and that each month we would call to set up refills.  Stressed importance of returning phone calls so that we could ensure they receive their medications in time each month.  Informed patient that we should be setting up refills 7-10 days prior to when they will run out of medication.  A pharmacist will reach out to perform a clinical assessment periodically.  Informed patient that a welcome packet and a drug information handout will be sent.      Patient verbalized understanding of the above information as well as how to contact the pharmacy at [phone] option 4 with any questions/concerns.  The pharmacy is open Monday through Friday 8:30am-4:30pm.  A pharmacist is available 24/7 via pager to answer any clinical questions they may have.    Patient Specific Needs     - Does the patient have any physical, cognitive, or cultural barriers? No    - Patient prefers to have medications discussed with  Patient     - Is the patient or caregiver able to read and understand education materials at a high school level or above? Yes    - Patient's primary language is  [place]     -  Is the patient high risk? Yes, patient is taking oral chemotherapy. Appropriateness of therapy has been assessed.     - Does the patient require a Care Management Plan? No     - Does the patient require physician intervention or other additional services (i.e. nutrition, smoking cessation, social work)? No      Rebel Laughridge [facility] Specialty Pharmacist

## 2019-11-12 NOTE — Unmapped (Signed)
[facility] Specialty Medication Onboarding    Specialty Medication: Capecitabine  Prior Authorization: Not Required   Financial Assistance: No - copay  <$25  Final Copay/Day Supply: $0 / 28    Insurance Restrictions: None     Notes to Pharmacist:     The triage team has completed the benefits investigation and has determined that the patient is able to fill this medication at [facility]. Please contact the patient to complete the onboarding or follow up with the prescribing physician as needed.

## 2019-11-17 DIAGNOSIS — C187 Malignant neoplasm of sigmoid colon: Principal | ICD-10-CM

## 2019-11-17 NOTE — Unmapped (Signed)
Pt states he has been nauseated and having dry heaves. Relaxing in recliner in room. Dr Carles Collet aware.

## 2019-11-18 DIAGNOSIS — C787 Secondary malignant neoplasm of liver and intrahepatic bile duct: Principal | ICD-10-CM

## 2019-11-18 MED FILL — CAPECITABINE 500 MG TABLET: 28 days supply | Qty: 120 | Fill #0 | Status: AC

## 2019-11-18 NOTE — Unmapped (Signed)
RADIATION ONCOLOGY TREATMENT PLANNING NOTE     Patient Name: Mark Calhoun  Patient Age: 54 y.o.  Date of Encounter: 11/17/2019    CLINICAL TREATMENT PLANNING:     Mark Calhoun has recurrent sigmoid colon cancer s/p re-resection. Refer to the consult for full clinical details.    I plan to treat him/her with photons utilizing IMRT  technique.     The radiation target area/treatment site will be the L abdominal/pelvic sidewall    I will attempt to minimize the dose to bowel, kidney, spinal cord, liver    The total radiation dose will be 5040 cGy at 180 cGy/fraction for a total of 28 fractions, treated once a day. Chemotherapy administered concurrently.    Treatment Intent: curative.    Technique Rationale:   IMRT:  IMRT is anticipated to be necessary to optimize the dose homogeneity to a complex tumor/target volume and minimize the dose to the normal tissue structures listed above    Simulation Order: I requested radiation treatment planning CT scan to acquire information regarding patient's anatomy of the treatment site, radiation treatment target volumes, and adjacent normal organs. This is required to be done in patient's treatment position for radiation treatment planning and for patient's subsequent radiation treatment positioning and treatment setups.     Image Fusion:  None    Special Treatment Procedure: Treatment planning for Mark Calhoun is more complex and requires more time because of concurrent chemotherapy necessitating more monitoring/management of acute toxicities.    Verification Simulation: I have ordered for the patient to come in for verification simulation on the treatment machine to ensure that information was transferred appropriately from the planning system to the treatment machine treatment and to verify patient setup, immobilization, and image guidance.    Image Guidance/Tracking: Daily CBCT/MVCT to assess the position accuracy of the target volume and critical structures.     SIMULATION: Type:  Initial simulation of the abdomen/pelvis    Contrast: IV contrast.    The patient was taken to the CT simulation room and placed in a supine position.  CT images were obtained from the low chest to bottom of the pelvis.  I approved the patient set up and reviewed the CT images; both are adequate. I tentatively plan to utilize multiple fields. The number and use of treatment devices will be determined at the time of computerized planning.    I have placed an isocenter/localization point in three dimensions on these images.  This was marked on the patient???s skin for subsequent radiation treatment set-up.  Additional details of the CT simulation are available in the departmental [facility] electronic medical record.  CT images were then transferred to the radiation treatment-planning computer for planning and dosimetry.    Rayetta Humphrey, MD  Assistant Professor  [facility] Dept of Radiation Oncology  11/17/2019

## 2019-11-19 DIAGNOSIS — C187 Malignant neoplasm of sigmoid colon: Principal | ICD-10-CM

## 2019-11-19 NOTE — Unmapped (Signed)
Scheduled pt for liver ablation with Dr. Virl Son with CT/GA for 6/9 at 0900.      Pt states he has not received covid vaccine, will need covid test.  Scheduled for 6/7 in [facility]

## 2019-11-30 ENCOUNTER — Ambulatory Visit: Admit: 2019-11-30 | Discharge: 2019-12-29 | Payer: PRIVATE HEALTH INSURANCE

## 2019-11-30 ENCOUNTER — Encounter
Admit: 2019-11-30 | Discharge: 2019-12-29 | Payer: PRIVATE HEALTH INSURANCE | Attending: Radiation Oncology | Primary: Radiation Oncology

## 2019-11-30 ENCOUNTER — Institutional Professional Consult (permissible substitution): Admit: 2019-11-30 | Discharge: 2019-12-29 | Payer: PRIVATE HEALTH INSURANCE

## 2019-11-30 ENCOUNTER — Encounter: Admit: 2019-11-30 | Discharge: 2019-12-29 | Payer: PRIVATE HEALTH INSURANCE

## 2019-12-01 DIAGNOSIS — C188 Malignant neoplasm of overlapping sites of colon: Principal | ICD-10-CM

## 2019-12-01 NOTE — Unmapped (Signed)
RADIATION TREATMENT MANAGEMENT NOTE     Encounter Date: 12/01/2019  Patient Name: Mark Calhoun  Medical Record Number: [id number]    DIAGNOSIS:  54yo with history of T3N1 sigmoid colon cancer s/p resection and adjuvant chemo in 2019 with anastamotic recurrence in 2020 with L sidewall/abdominal wall nodules.  He is now s/p chemo and excision of all sites of abdominal disease followed by HIPEC.    ASSESSMENT: 400cGy of planned 5000cGy  Karnofsky/Lansky Performance Status: 80, Normal activity with effort; some signs or symptoms of disease (ECOG equivalent 1)  Chemotherapy/Systemic therapy:administered concurrently (xeloda)  Clinical Trial:   no    RECOMMENDATIONS:  1. Plan for Therapy: Continue treatment as planned.  He is taking xeloda  2. GI:  No issues  3. GU: No issues  4. Pain: mild LLQ pain- at baseline  5. Follow-up: For status check next week    SUBJECTIVE: Started treatment yesterday without any issues.  Overall he fells well and his abdominal pain that he was experiencing on the day of his CT sim resolved within 1-2 days.  He has some baseline LLQ pain but not overly bothersome.  Bowels doing well- no change.  He is a little fatigued.  Taking xeloda without any problems.     PHYSICAL EXAM:  Vital Signs for this encounter:  There were no vitals taken for this visit.  Last weight:    Wt Readings from Last 4 Encounters:   11/11/19 75.5 kg (166 lb 7.2 oz)   11/10/19 76.2 kg (168 lb)   10/27/19 69.6 kg (153 lb 7 oz)   10/06/19 69.7 kg (153 lb 9.6 oz)     General:  Alert and Orientated X 3.  No acute distress.    Skin: Not examined    I have reviewed the patient's dose delivery, dosimetry, lab tests, patient treatment set-up, port films, treatment parameters and x-rays.    Rayetta Humphrey, MD  Assistant Professor  [facility] Dept of Radiation Oncology  12/01/2019

## 2019-12-01 NOTE — Unmapped (Signed)
12/01/2019    Dose Site Summary:  Rx:Lt Abd Sid: 12/01/2019: 400/5,000 cGy  Subjective/Assessment/Recommendations:    1. Nutrition:eating well without diffiulty.   2. G-Tube status:  3. Chemo:  4. Nausea/Vomiting: denies  5. Bowel elimination:  6. Urinary elimination:  7. Fatigue:  8. Pain:  9. Prescription Needs:  10. Psychosocial:  11. Other:

## 2019-12-02 ENCOUNTER — Ambulatory Visit
Admit: 2019-12-02 | Discharge: 2019-12-02 | Payer: PRIVATE HEALTH INSURANCE | Attending: Hematology & Oncology | Primary: Hematology & Oncology

## 2019-12-02 ENCOUNTER — Institutional Professional Consult (permissible substitution): Admit: 2019-12-02 | Discharge: 2019-12-02 | Payer: PRIVATE HEALTH INSURANCE

## 2019-12-02 DIAGNOSIS — C187 Malignant neoplasm of sigmoid colon: Principal | ICD-10-CM

## 2019-12-02 LAB — CBC W/ AUTO DIFF
BASOPHILS ABSOLUTE COUNT: 0 10*9/L (ref 0.0–0.1)
BASOPHILS RELATIVE PERCENT: 0.5 %
EOSINOPHILS ABSOLUTE COUNT: 0.1 10*9/L (ref 0.0–0.7)
HEMATOCRIT: 32.1 % — ABNORMAL LOW (ref 38.0–50.0)
HEMOGLOBIN: 10.4 g/dL — ABNORMAL LOW (ref 13.5–17.5)
LYMPHOCYTES RELATIVE PERCENT: 25.4 %
MEAN CORPUSCULAR HEMOGLOBIN CONC: 32.4 g/dL (ref 30.0–36.0)
MEAN CORPUSCULAR HEMOGLOBIN: 26.3 pg (ref 26.0–34.0)
MEAN CORPUSCULAR VOLUME: 81.1 fL (ref 81.0–95.0)
MEAN PLATELET VOLUME: 7.1 fL (ref 7.0–10.0)
MONOCYTES ABSOLUTE COUNT: 0.3 10*9/L (ref 0.1–1.0)
MONOCYTES RELATIVE PERCENT: 10.2 %
NEUTROPHILS ABSOLUTE COUNT: 1.5 10*9/L — ABNORMAL LOW (ref 1.7–7.7)
NEUTROPHILS RELATIVE PERCENT: 59.1 %
NUCLEATED RED BLOOD CELLS: 0 /100{WBCs} (ref <=4)
PLATELET COUNT: 87 10*9/L — ABNORMAL LOW (ref 150–450)
RED BLOOD CELL COUNT: 3.96 10*12/L — ABNORMAL LOW (ref 4.32–5.72)
RED CELL DISTRIBUTION WIDTH: 16.9 % — ABNORMAL HIGH (ref 12.0–15.0)
WBC ADJUSTED: 2.5 10*9/L — ABNORMAL LOW (ref 3.5–10.5)

## 2019-12-02 LAB — COMPREHENSIVE METABOLIC PANEL
ALBUMIN: 3.5 g/dL (ref 3.4–5.0)
ALKALINE PHOSPHATASE: 61 U/L (ref 46–116)
ANION GAP: 4 mmol/L (ref 3–11)
BILIRUBIN TOTAL: 1.2 mg/dL (ref 0.3–1.2)
BLOOD UREA NITROGEN: 13 mg/dL (ref 9–23)
BUN / CREAT RATIO: 15
CALCIUM: 9.1 mg/dL (ref 8.7–10.4)
CHLORIDE: 112 mmol/L — ABNORMAL HIGH (ref 98–107)
CO2: 28.4 mmol/L (ref 20.0–31.0)
CREATININE: 0.85 mg/dL (ref 0.60–1.10)
EGFR CKD-EPI AA MALE: >90 mL/min/{1.73_m2}
EGFR CKD-EPI NON-AA MALE: >90 mL/min/{1.73_m2}
GLUCOSE RANDOM: 104 mg/dL (ref 70–179)
POTASSIUM: 3.8 mmol/L (ref 3.5–5.1)
PROTEIN TOTAL: 7.2 g/dL (ref 5.7–8.2)
SODIUM: 144 mmol/L (ref 135–145)

## 2019-12-02 LAB — EGFR CKD-EPI AA MALE: Glomerular filtration rate/1.73 sq M.predicted.black:ArVRat:Pt:Ser/Plas/Bld:Qn:Creatinine-based formula (CKD-EPI): >90

## 2019-12-02 LAB — BASOPHILS ABSOLUTE COUNT: Basophils:NCnc:Pt:Bld:Qn:Automated count: 0

## 2019-12-02 NOTE — Unmapped (Signed)
[facility]     PRIMARY CARE PROVIDER:  Carlynn Herald, MD  [address]  [place] [address]    CONSULTING PROVIDERS  Dr. Nemiah Commander, [facility] Colorectal Surgery   Dr. Reymundo Poll, [facility] Surgical Oncology  Dr. Rayetta Humphrey, [facility] Radiation Oncology  ____________________________________________________________________    CANCER HISTORY  Diagnosis 03/2018 stage III colon cancer presenting with severe anemia   1. 03/17/18 lap left/sigmoid colectomy: pT3N1b (3/42) moderately differentiated adenocarcinoma, discontig tumor nodules present, intermediate tumor budding,  IHC intact for MMR enzymes (MLH1, MSH2, MSH6, PMS2). preop CEA 4.2   2. 04/09/18-08/2018: CapeOX x 8, final cycle capecitabine only   3. 04/2019 surveillance detected local recurrence   4. 05/06/19-07/2019 FOLFIRI  5. 08/18/19 ex lap, partial proctocolectomy with unblock pelvic sidewal resection, omentectomy, appendectomy, HIPEC with mitomycin c; diverting loop ileostomy  6. 10/26/2019 ileostomy takedown  7. 12/01/2019- ongoing chemoradiotherapy with capecitabine to left pelvis sidewall (planned 5000 cGy)    UGT1A1 *1/28     Tumor Molecular Genetics:  Tempus on primary, confirmed on repeat of recurrence;   TP53 mutation  KRAS/NRAS/BRAF WT    TREATMENT PLAN:  Pelvic sidewall RT +  ablation liver met.   ____________________________________________________________________    ASSESSMENT  1. Metastatic, RAS/RAF WT, pMMR sigmoid colon cancer,  new liver met  2. Multifactorial cytopenias  3. 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 postMRI nausea,possibly related to gad  6. ECOG PS 1    RECOMMENDATIONS  1.cont RT to pelvic sidewall. + radiosensitizing cape at 1500 mg BID  2. Ablation liver met scheduled for next week  3. RTC for visit with me and repeat labs in 2 weeks    DISCUSSION   tolerating treatment well so far. Reviewed possible symptoms that may mount--counts could well become an issue over time but will continue to monitor. He's improving strength so far, think he'll do ok with dual ablation and RT next week.     ______________________________________________________________________  HISTORY     Mark Calhoun is seen today at the [facility] for ongoing management regarding colon cancer    Getting stronger--able to walk 1-1.5 miles 3-4 times a day. startd very light weights. Appetite doing ok and he's gained a bit of weight (had three days post MRI of nausea and not eating well but that has resolved)  Same LLQ discomfort he's had since initial surgery.       MEDICAL HISTORY  Interval: none  Prior:   1. Gout-  2. oxaliplatin-induced splenomegaly    No Known Allergies  Medications reviewed in the EMR    SOCIAL HISTORY   Current:  Comes with his wife [name].   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 His brother [name] is my partner in medical oncology. Sai is married and has two teenage daughters (17 and 48 as of 03/2018).      PHYSICAL EXAM  BP 145/93  - Pulse 78  - Temp 37 ??C (98.6 ??F) (Temporal)  - Resp 16  - Ht 180.3 cm (5' 11)  - Wt 75.3 kg (166 lb)  - SpO2 98%  - BMI 23.15 kg/m??    GENERAL: well developed, well nourished, in no distress  HEENT: NCAT, pupils equal, sclerae anicteric, PSYCH: full and appropriate range of affect with good insight and judgement   abdomen  ileostomy site closed, small scab residual. Soft and nontender.     OBJECTIVE DATA  Labs reviewed in emr    mild leukopenia and tpenia    RADIOLOGY RESULTS   none

## 2019-12-03 NOTE — Unmapped (Signed)
I spoke with patient Mark Calhoun to confirm appointments on the following date(s): 02/09/2020    Joycelyn Das Jaydan Meidinger

## 2019-12-06 ENCOUNTER — Encounter: Admit: 2019-12-06 | Discharge: 2019-12-07 | Payer: PRIVATE HEALTH INSURANCE | Attending: Family | Primary: Family

## 2019-12-06 NOTE — Unmapped (Signed)
VIR pre procedure prep call completed. [facility] then proceed to [facility] on 2nd floor for check-in.  NPO guidelines reviewed. Pt OK to take sips of clear liquids with all AM meds. Will bring his Xeloda so that he can take it post-procedure with food. COVID screening completed. Pt on his way to get his Covid test now. 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 Wife will be accompanying pt. Pt verbalized understanding. All questions answered.

## 2019-12-06 NOTE — Unmapped (Signed)
COVID Pre-Procedure Intake Form     Assessment     Mark Calhoun is a 54 y.o. male presenting to [facility] for COVID testing.     Plan     If no testing performed, pt counseled on routine care for respiratory illness.  If testing performed, COVID sent.  Patient directed to Home given findings during today's visit.    Subjective     Mark Calhoun is a 54 y.o. male who [facility] the [facility] with complaints of the following:    Exposure History: In the last 21 days?     Have you traveled outside of [place]? No               Have you been in close contact with someone confirmed by a test to have COVID? (Close contact is within 6 feet for at least 10 minutes) No       Have you worked in a health care facility? No     Lived or worked facility like a nursing home, group home, or assisted living?    No         Are you scheduled to have surgery or a procedure in the next 3 days? Yes               Are you scheduled to receive cancer chemotherapy within the next 7 days?    Yes     Have you ever been tested before for COVID-19 with a swab of your nose? Yes: When: n/a, Where: [place]   Are you a healthcare worker being tested so to return to work No     Right now,  do you have any of the following that developed over the past 7 days (as stated by patient on intake form):    Subjective fever (felt feverish) No   Chills (especially repeated shaking chills) No   Severe fatigue (felt very tired) No   Muscle aches No   Runny nose No   Sore throat No   Loss of taste or smell No   Cough (new onset or worsening of chronic cough) No   Shortness of breath No   Nausea or vomiting No   Headache No   Abdominal Pain No   Diarrhea (3 or more loose stools in last 24 hours) No       Scribe's Attestation: Taye Cato L. Lorin Picket, FNP obtained and performed the history, physical exam and medical decision making elements that were entered into the chart.  Signed by [place] L Owens, CMA serving as Scribe, on 12/06/2019 10:41 AM      The documentation recorded by the scribe accurately reflects the service I personally performed and the decisions made by me. Aida Puffer, FNP  December 06, 2019 12:01 PM

## 2019-12-08 ENCOUNTER — Encounter: Admit: 2019-12-08 | Discharge: 2019-12-08 | Payer: PRIVATE HEALTH INSURANCE

## 2019-12-08 ENCOUNTER — Encounter
Admit: 2019-12-08 | Discharge: 2019-12-08 | Payer: PRIVATE HEALTH INSURANCE | Attending: Anesthesiology | Primary: Anesthesiology

## 2019-12-08 DIAGNOSIS — C787 Secondary malignant neoplasm of liver and intrahepatic bile duct: Principal | ICD-10-CM

## 2019-12-08 DIAGNOSIS — C187 Malignant neoplasm of sigmoid colon: Principal | ICD-10-CM

## 2019-12-08 LAB — PROTIME: Coagulation tissue factor induced:Time:Pt:PPP:Qn:Coag: 13.7 — ABNORMAL HIGH

## 2019-12-08 MED ORDER — TRAMADOL 50 MG TABLET
ORAL_TABLET | Freq: Four times a day (QID) | ORAL | 0 refills | 5 days | Status: CP
Start: 2019-12-08 — End: 2019-12-13
  Filled 2019-12-08: qty 20, 5d supply, fill #0

## 2019-12-08 MED ORDER — LEVOFLOXACIN 500 MG TABLET
ORAL_TABLET | ORAL | 0 refills | 5.00000 days | Status: CP
Start: 2019-12-08 — End: 2019-12-13
  Filled 2019-12-08: qty 5, 5d supply, fill #0

## 2019-12-08 MED ORDER — OXYCODONE 5 MG TABLET
ORAL_TABLET | ORAL | 0 refills | 2 days | Status: CP | PRN
Start: 2019-12-08 — End: 2019-12-13
  Filled 2019-12-08: qty 10, 1d supply, fill #0

## 2019-12-08 MED ADMIN — acetaminophen (TYLENOL) tablet 650 mg: 650 mg | ORAL | @ 13:00:00 | Stop: 2019-12-08

## 2019-12-08 MED ADMIN — ondansetron (ZOFRAN) injection 4 mg: 4 mg | INTRAVENOUS | @ 16:00:00 | Stop: 2019-12-08

## 2019-12-08 MED ADMIN — ondansetron (ZOFRAN) injection: INTRAVENOUS | @ 14:00:00 | Stop: 2019-12-08

## 2019-12-08 MED ADMIN — fentaNYL (PF) (SUBLIMAZE) injection: INTRAVENOUS | @ 14:00:00 | Stop: 2019-12-08

## 2019-12-08 MED ADMIN — ROCuronium (ZEMURON) injection: INTRAVENOUS | @ 14:00:00 | Stop: 2019-12-08

## 2019-12-08 MED ADMIN — sugammadex (BRIDION) injection: INTRAVENOUS | @ 14:00:00 | Stop: 2019-12-08

## 2019-12-08 MED ADMIN — lactated Ringers infusion: INTRAVENOUS | @ 14:00:00 | Stop: 2019-12-08

## 2019-12-08 MED ADMIN — lidocaine (XYLOCAINE) 20 mg/mL (2 %) injection: INTRAVENOUS | @ 14:00:00 | Stop: 2019-12-08

## 2019-12-08 MED ADMIN — midazolam (VERSED) injection: INTRAVENOUS | @ 13:00:00 | Stop: 2019-12-08

## 2019-12-08 MED ADMIN — ampicillin-sulbactam (UNASYN) injection 3 g: 3 g | INTRAVENOUS | @ 14:00:00 | Stop: 2019-12-08

## 2019-12-08 MED ADMIN — propofoL (DIPRIVAN) injection: INTRAVENOUS | @ 14:00:00 | Stop: 2019-12-08

## 2019-12-08 MED FILL — LEVOFLOXACIN 500 MG TABLET: 5 days supply | Qty: 5 | Fill #0 | Status: AC

## 2019-12-08 MED FILL — OXYCODONE 5 MG TABLET: 1 days supply | Qty: 10 | Fill #0 | Status: AC

## 2019-12-08 MED FILL — TRAMADOL 50 MG TABLET: 5 days supply | Qty: 20 | Fill #0 | Status: AC

## 2019-12-08 NOTE — Unmapped (Signed)
[facility] INTERVENTIONAL RADIOLOGY - Pre Procedure H/P      Assessment/Plan:    [name] is a 54 y.o. male who will undergo percutaneous ablation of liver metastasis in Interventional Radiology.      Consent obtained by Dr. Virl Son  --This procedure has been fully reviewed with the patient/patient???s authorized representative. The risks, benefits and alternatives have been explained, and the patient/patient???s authorized representative has consented to the procedure.  --The patient will accept blood products in an emergent situation.  --The patient does not have a Do Not Resuscitate order in effect.      HPI: [name] is a 54 y.o. male with history of colon cancer with liver metastasis here for percutaneous ablation.     Allergies: No Known Allergies    Medications:  No relevant medications, please see full medication list in Epic.    PE:    There were no vitals filed for this visit.  General: male in NAD.  Lungs: Respirations even and non labored    Mark Frison, FNP-BC  12/08/2019 8:15 AM

## 2019-12-08 NOTE — Unmapped (Signed)
VIR Post-Procedure Note    Procedure Name: IR ABLATION LIVER TUMOR(S) 1 OR MORE [IMG4030]    Pre-Op Diagnosis: Colon Cancer Metastasis    Post-Op Diagnosis: Same as pre-operative diagnosis    VIR Providers    Attending: Dr. Claretta Fraise  Assistant: Dr. Levert Feinstein    Description of procedure: CT guided liver ablation of Segment 4 metastatic lesion.     Sedation: GA    Estimated Blood Loss: approximately 5 mL  Specimens: None     Complications: None      See detailed procedure note with images in PACS (IMPAX).    The patient tolerated the procedure well without incident or complication and was returned to the PRU in stable condition.    Levert Feinstein, MD  12/08/2019 10:18 AM

## 2019-12-09 DIAGNOSIS — C20 Malignant neoplasm of rectum: Principal | ICD-10-CM

## 2019-12-09 NOTE — Unmapped (Signed)
12/09/2019    Dose Site Summary:  Rx:Lt Abd Sid: 12/09/2019: 1,400/5,000 cGy  Subjective/Assessment/Recommendations:    1. Fatigue:  2. Pain:  3. Urinary Elimination:  4. Bowel Elimination:  5. Prescription Needs:  6. Psychosocial:  7. Other:

## 2019-12-09 NOTE — Unmapped (Signed)
Post procedure phone call complete. Pt states he is doing well, pain has been well managed with prescription medication. Advised to call back with any questions or concerns.

## 2019-12-09 NOTE — Unmapped (Signed)
RADIATION TREATMENT MANAGEMENT NOTE     Encounter Date: 12/09/2019  Patient Name: Mark Calhoun  Medical Record Number: [id number]    DIAGNOSIS:  54yo with history of T3N1 sigmoid colon cancer s/p resection and adjuvant chemo in 2019 with anastamotic recurrence in 2020 with L sidewall/abdominal wall nodules.  He is now s/p chemo and excision of all sites of abdominal disease followed by HIPEC.    ASSESSMENT: 1400cGy of planned 5000cGy  Karnofsky/Lansky Performance Status: 80, Normal activity with effort; some signs or symptoms of disease (ECOG equivalent 1)  Chemotherapy/Systemic therapy:administered concurrently (xeloda)  Clinical Trial:   no    RECOMMENDATIONS:  1. Plan for Therapy: Continue treatment as planned.  He is taking xeloda  2. GI:  No issues, rare nausea with xeloda  3. GU: No issues  4. Pain: mild LLQ pain- at baseline  5. Follow-up: For status check next week    SUBJECTIVE: Doing well today.  He had an ablation of his liver metastasis yesterday and had some post-procedural n/v but feels much better now.  Occasional nausea when he takes the xeloda but otherwise no issues.  Bowels are at baseline.  No issues with LLQ pain currently    PHYSICAL EXAM:  Vital Signs for this encounter:  There were no vitals taken for this visit.  Last weight:    Wt Readings from Last 4 Encounters:   12/02/19 75.3 kg (166 lb)   11/11/19 75.5 kg (166 lb 7.2 oz)   11/10/19 76.2 kg (168 lb)   10/27/19 69.6 kg (153 lb 7 oz)     General:  Alert and Orientated X 3.  No acute distress.    Skin: Not examined    I have reviewed the patient's dose delivery, dosimetry, lab tests, patient treatment set-up, port films, treatment parameters and x-rays.    Rayetta Humphrey, MD  Assistant Professor  [facility] Dept of Radiation Oncology  12/09/2019

## 2019-12-10 NOTE — Unmapped (Signed)
[facility] Clinical Assessment & Refill Coordination Note    Mark Calhoun, DOB: 1965-10-10  Phone: [phone] (home)     All above HIPAA information was verified with patient.     Was a Nurse, learning disability used for this call? No    Specialty Medication(s):   Hematology/Oncology: Capecitabine 500mg , directions: Take 3 tablets (1,500 mg total) by mouth Two (2) times a day on radiation days (Monday-Friday).     Current Outpatient Medications   Medication Sig Dispense Refill   ??? acetaminophen (TYLENOL) 325 MG tablet Take 2 tablets (650 mg total) by mouth every six (6) hours as needed. 30 tablet 0   ??? capecitabine (XELODA) 500 MG tablet Take 3 tablets (1,500 mg total) by mouth Two (2) times a day on radiation days (Monday-Friday). 168 tablet 0   ??? cholecalciferol, vitamin D3, (VITAMIN D3) 5,000 unit tablet Take 5,000 Units by mouth daily.     ??? colchicine (MITIGARE) 0.6 mg cap capsule Take as needed for gout flares. Take 1.2 mg at the first sign of flare, followed in 1 hour with a single dose of 0.6. Then take 0.6 mg twice daily until flare resolves 30 capsule 2   ??? levoFLOXacin (LEVAQUIN) 500 MG tablet Take 1 tablet (500 mg total) by mouth daily for 5 days. 5 tablet 0   ??? multivitamin (TAB-A-VITE/THERAGRAN) per tablet Take 1 tablet by mouth daily.     ??? oxyCODONE (ROXICODONE) 5 MG immediate release tablet Take 1 tablet (5 mg total) by mouth every four (4) hours as needed for pain for up to 5 days. 10 tablet 0   ??? traMADoL (ULTRAM) 50 mg tablet Take 1 tablet (50 mg total) by mouth Every six (6) hours for 5 days. 20 tablet 0     No current facility-administered medications for this visit.        Changes to medications: Mark Calhoun reports no changes at this time.    No Known Allergies    Changes to allergies: No    SPECIALTY MEDICATION ADHERENCE     capecitabine 500 mg: 6 days of medicine on hand     Medication Adherence    Patient reported X missed doses in the last month: 0  Specialty Medication: capecitabine 500 mg - 3 tabs bid on radiation days M-F  Patient is on additional specialty medications: No  Informant: patient  Confirmed plan for next specialty medication refill: delivery by pharmacy          Specialty medication(s) dose(s) confirmed: Regimen is correct and unchanged.     Are there any concerns with adherence? No    Adherence counseling provided? Not needed    CLINICAL MANAGEMENT AND INTERVENTION      Clinical Benefit Assessment:    Do you feel the medicine is effective or helping your condition? Yes    Clinical Benefit counseling provided? Not needed    Adverse Effects Assessment:    Are you experiencing any side effects? No    Are you experiencing difficulty administering your medicine? No    Quality of Life Assessment:    How many days over the past month did your colon cancer  keep you from your normal activities? For example, brushing your teeth or getting up in the morning. 0    Have you discussed this with your provider? Not needed    Therapy Appropriateness:    Is therapy appropriate? Yes, therapy is appropriate and should be continued    DISEASE/MEDICATION-SPECIFIC INFORMATION  N/A    PATIENT SPECIFIC NEEDS     - Does the patient have any physical, cognitive, or cultural barriers? No    - Is the patient high risk? Yes, patient is taking oral chemotherapy. Appropriateness of therapy as been assessed.     - Does the patient require a Care Management Plan? No     - Does the patient require physician intervention or other additional services (i.e. nutrition, smoking cessation, social work)? No      SHIPPING     Specialty Medication(s) to be Shipped:   Hematology/Oncology: Capecitabine 500mg , directions: Take 3 tablets (1,500 mg total) by mouth Two (2) times a day on radiation days (Monday-Friday).    Other medication(s) to be shipped: none     Changes to insurance: No    Delivery Scheduled: Yes, Expected medication delivery date: 12/14/19.     Medication will be delivered via UPS to the confirmed prescription address in [facility].    The patient will receive a drug information handout for each medication shipped and additional FDA Medication Guides as required.  Verified that patient has previously received a Conservation officer, historic buildings.    All of the patient's questions and concerns have been addressed.    Breck Coons [facility] Specialty Pharmacist

## 2019-12-13 MED FILL — CAPECITABINE 500 MG TABLET: 10 days supply | Qty: 48 | Fill #1 | Status: AC

## 2019-12-13 MED FILL — CAPECITABINE 500 MG TABLET: ORAL | 10 days supply | Qty: 48 | Fill #1

## 2019-12-15 DIAGNOSIS — C19 Malignant neoplasm of rectosigmoid junction: Principal | ICD-10-CM

## 2019-12-15 NOTE — Unmapped (Signed)
12/15/2019    Dose Site Summary:  Rx:Lt Abd Sid: 12/15/2019: 2,200/5,000 cGy  Subjective/Assessment/Recommendations:    1. Nutrition:  2. G-Tube status:  3. Chemo:  4. Nausea/Vomiting:  5. Bowel elimination:  6. Urinary elimination:  7. Fatigue:  8. Pain:  9. Prescription Needs:  10. Psychosocial:  11. Other:

## 2019-12-15 NOTE — Unmapped (Signed)
RADIATION TREATMENT MANAGEMENT NOTE     Encounter Date: 12/15/2019  Patient Name: Mark Calhoun  Medical Record Number: [id number]    DIAGNOSIS:  54yo with history of T3N1 sigmoid colon cancer s/p resection and adjuvant chemo in 2019 with anastamotic recurrence in 2020 with L sidewall/abdominal wall nodules.  He is now s/p chemo and excision of all sites of abdominal disease followed by HIPEC.    ASSESSMENT: 2200cGy of planned 5000cGy  Karnofsky/Lansky Performance Status: 80, Normal activity with effort; some signs or symptoms of disease (ECOG equivalent 1)  Chemotherapy/Systemic therapy:administered concurrently (xeloda)  Clinical Trial:   no    RECOMMENDATIONS:  1. Plan for Therapy: Continue treatment as planned.  He is taking xeloda.  Successful ablation of liver lesion with IR last week.  2. GI:  No issues, rare nausea with xeloda  3. GU: No issues  4. Pain: mild LLQ pain- at baseline  5. Follow-up: For status check next week    SUBJECTIVE: He continues to feel well today.  He had an ablation of his liver lesion last week with IR- this went well and he has no lingering issues related to this.  He has a dull pain in his LLQ at baseline that is unchanged.  Bowels at baseline.  Some rare nausea but nothing too terrible.  His kids are doing well (one at [employer], two taking summer classes)    PHYSICAL EXAM:  Vital Signs for this encounter:  There were no vitals taken for this visit.  Last weight:    Wt Readings from Last 4 Encounters:   12/02/19 75.3 kg (166 lb)   11/11/19 75.5 kg (166 lb 7.2 oz)   11/10/19 76.2 kg (168 lb)   10/27/19 69.6 kg (153 lb 7 oz)     General:  Alert and Orientated X 3.  No acute distress.    Skin: No skin darkening.    I have reviewed the patient's dose delivery, dosimetry, lab tests, patient treatment set-up, port films, treatment parameters and x-rays.    Rayetta Humphrey, MD  Assistant Professor  [facility] Dept of Radiation Oncology  12/15/2019

## 2019-12-16 ENCOUNTER — Institutional Professional Consult (permissible substitution): Admit: 2019-12-16 | Discharge: 2019-12-16 | Payer: PRIVATE HEALTH INSURANCE

## 2019-12-16 ENCOUNTER — Encounter
Admit: 2019-12-16 | Discharge: 2019-12-16 | Payer: PRIVATE HEALTH INSURANCE | Attending: Hematology & Oncology | Primary: Hematology & Oncology

## 2019-12-16 DIAGNOSIS — C787 Secondary malignant neoplasm of liver and intrahepatic bile duct: Principal | ICD-10-CM

## 2019-12-16 DIAGNOSIS — D61810 Antineoplastic chemotherapy induced pancytopenia: Principal | ICD-10-CM

## 2019-12-16 DIAGNOSIS — C187 Malignant neoplasm of sigmoid colon: Principal | ICD-10-CM

## 2019-12-16 DIAGNOSIS — T451X5A Adverse effect of antineoplastic and immunosuppressive drugs, initial encounter: Principal | ICD-10-CM

## 2019-12-16 LAB — CBC W/ AUTO DIFF
BASOPHILS ABSOLUTE COUNT: 0 10*9/L (ref 0.0–0.1)
BASOPHILS RELATIVE PERCENT: 0.3 %
EOSINOPHILS ABSOLUTE COUNT: 0.1 10*9/L (ref 0.0–0.7)
EOSINOPHILS RELATIVE PERCENT: 3.5 %
HEMATOCRIT: 30.8 % — ABNORMAL LOW (ref 38.0–50.0)
HEMOGLOBIN: 10.2 g/dL — ABNORMAL LOW (ref 13.5–17.5)
LYMPHOCYTES ABSOLUTE COUNT: 0.3 10*9/L — ABNORMAL LOW (ref 0.7–4.0)
MEAN CORPUSCULAR HEMOGLOBIN CONC: 33.2 g/dL (ref 30.0–36.0)
MEAN CORPUSCULAR HEMOGLOBIN: 27.1 pg (ref 26.0–34.0)
MEAN CORPUSCULAR VOLUME: 81.6 fL (ref 81.0–95.0)
MEAN PLATELET VOLUME: 8.1 fL (ref 7.0–10.0)
MONOCYTES ABSOLUTE COUNT: 0.2 10*9/L (ref 0.1–1.0)
MONOCYTES RELATIVE PERCENT: 14.9 %
NEUTROPHILS ABSOLUTE COUNT: 1 10*9/L — ABNORMAL LOW (ref 1.7–7.7)
NEUTROPHILS RELATIVE PERCENT: 64.2 %
NUCLEATED RED BLOOD CELLS: 0 /100{WBCs} (ref <=4)
PLATELET COUNT: 60 10*9/L — ABNORMAL LOW (ref 150–450)
RED CELL DISTRIBUTION WIDTH: 16.9 % — ABNORMAL HIGH (ref 12.0–15.0)
WBC ADJUSTED: 1.5 10*9/L — ABNORMAL LOW (ref 3.5–10.5)

## 2019-12-16 LAB — HEPATIC FUNCTION PANEL
ALKALINE PHOSPHATASE: 59 U/L (ref 46–116)
ALT (SGPT): 12 U/L (ref 10–49)
BILIRUBIN DIRECT: 0.4 mg/dL — ABNORMAL HIGH (ref 0.00–0.30)
BILIRUBIN TOTAL: 1.4 mg/dL — ABNORMAL HIGH (ref 0.3–1.2)

## 2019-12-16 LAB — BILIRUBIN TOTAL: Bilirubin:MCnc:Pt:Ser/Plas:Qn:: 1.4 — ABNORMAL HIGH

## 2019-12-16 LAB — RED CELL DISTRIBUTION WIDTH: Lab: 16.9 — ABNORMAL HIGH

## 2019-12-16 MED ADMIN — heparin, porcine (PF) 100 unit/mL injection 500 Units: 500 [IU] | INTRAVENOUS | @ 15:00:00 | Stop: 2019-12-16

## 2019-12-16 NOTE — Unmapped (Signed)
Pt's port accessed - flushed, bld return noted, labs drawn and heplocked. Port de-accessed. Pt tolerated well.

## 2019-12-16 NOTE — Unmapped (Signed)
[facility]     PRIMARY CARE PROVIDER:  Carlynn Herald, MD  [address]  [place] [address]    CONSULTING PROVIDERS  Dr. Nemiah Commander, [facility] Colorectal Surgery   Dr. Reymundo Poll, [facility] Surgical Oncology  Dr. Rayetta Humphrey, [facility] Radiation Oncology  ____________________________________________________________________    CANCER HISTORY  Diagnosis 03/2018 stage III colon cancer presenting with severe anemia   1. 03/17/18 lap left/sigmoid colectomy: pT3N1b (3/42) moderately differentiated adenocarcinoma, discontig tumor nodules present, intermediate tumor budding,  IHC intact for MMR enzymes (MLH1, MSH2, MSH6, PMS2). preop CEA 4.2   2. 04/09/18-08/2018: CapeOX x 8, final cycle capecitabine only   3. 04/2019 surveillance detected local recurrence   4. 05/06/19-07/2019 FOLFIRI  5. 08/18/19 ex lap, partial proctocolectomy with unblock pelvic sidewal resection, omentectomy, appendectomy, HIPEC with mitomycin c; diverting loop ileostomy  6. 10/26/2019 ileostomy takedown  7. 12/01/2019- ongoing chemoradiotherapy with capecitabine to left pelvis sidewall (planned 5000 cGy)    UGT1A1 *1/28     Tumor Molecular Genetics:  Tempus on primary, confirmed on repeat of recurrence;   TP53 mutation  KRAS/NRAS/BRAF WT    TREATMENT PLAN:  Pelvic sidewall RT +  ablation liver met.   ____________________________________________________________________    ASSESSMENT  1. Metastatic, RAS/RAF WT, pMMR sigmoid colon cancer   2. Multifactorial pancytopenias  3. 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 postMRI nausea,possibly related to gad  6. ECOG PS 1    RECOMMENDATIONS  1.cont RT to pelvic sidewall. + radiosensitizing cape, decrease to 1000 mg BID  2. Repeat blood work next week and the following  3. RTC for visit with me and repeat labs in 2 weeks; ~6 weeks post CRT CT chest and MRI abd    DISCUSSION  Continues to tolerate treatment well but with pancytopenia, which is increasing. Some is from spleen, but need to dose reduce to avoid severe toxicity/npenia and infection.   ______________________________________________________________________  HISTORY     Mark Calhoun is seen today at the [facility] for ongoing management regarding colon cancer    A bit fatigued-- his stamina on walks isnt as good. Eating ok, weight down here but stable on scale at home. A bit more discomfort in LLq site, but not severe. Did great with ablation. Really not much pain or trouble aftwaerds at all    No HFS  No diarrhea      MEDICAL HISTORY  Interval: none  Prior:   1. Gout-  2. oxaliplatin-induced splenomegaly    SOCIAL HISTORY   Current:  Comes with his wife Mark Calhoun. 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 His brother Mark Calhoun is my partner in medical oncology. Mark Calhoun is married and has two teenage daughters (17 and 61 as of 03/2018).      PHYSICAL EXAM  BP 140/89  - Pulse 72  - Temp 36.8 ??C (98.2 ??F) (Temporal)  - Resp 16  - Ht 180.3 cm (5' 11)  - Wt 74.8 kg (164 lb 12.8 oz)  - SpO2 99%  - BMI 22.98 kg/m??    GENERAL: well developed, well nourished, in no distress  HEENT: NCAT, pupils equal, sclerae anicteric, PSYCH: full and appropriate range of affect with good insight and judgement  Skin: no handfoot     OBJECTIVE DATA  Labs reviewed in emr    mild leukopenia and tpenia, increased  RADIOLOGY RESULTS   none

## 2019-12-16 NOTE — Unmapped (Signed)
C/o worsening left side pain

## 2019-12-16 NOTE — Unmapped (Addendum)
Summary from today:  Decrease capecitabine to 1000 mg twice a day  Repeat blood work next week on Wed with [place] visit  Back here for check up with me in 2 weeks    For health related questions call:    For appointments & questions Monday through Friday 8 AM??? 5 PM   please call [phone] or Toll free [phone].    On Nights, Weekends and Holidays  Call [phone] and ask for the oncologist on call.    Your GI Medical OncologyTreatment Team    Doctor: Heber Lemitar. Forbes Cellar, MD  Nurse Practitioner: Eula Fried, ANP  Nurse Navigator: Ted Mcalpine, RN  Clinical Pharmacist: Konrad Penta, PharmD  Scheduling Administrator: Ms. Roanna Banning

## 2019-12-16 NOTE — Unmapped (Signed)
I spoke with patient Mark Calhoun to confirm appointments on the following date(s):  12/30/19 time change for 11:30am with Dr. Forbes Cellar.    Mark Calhoun

## 2019-12-22 DIAGNOSIS — C19 Malignant neoplasm of rectosigmoid junction: Principal | ICD-10-CM

## 2019-12-22 DIAGNOSIS — C187 Malignant neoplasm of sigmoid colon: Principal | ICD-10-CM

## 2019-12-22 LAB — CBC W/ AUTO DIFF
BASOPHILS ABSOLUTE COUNT: 0 10*9/L (ref 0.0–0.1)
BASOPHILS RELATIVE PERCENT: 0.4 %
EOSINOPHILS RELATIVE PERCENT: 3.7 %
HEMATOCRIT: 32.6 % — ABNORMAL LOW (ref 38.0–50.0)
LYMPHOCYTES ABSOLUTE COUNT: 0.3 10*9/L — ABNORMAL LOW (ref 0.7–4.0)
LYMPHOCYTES RELATIVE PERCENT: 19.4 %
MEAN CORPUSCULAR HEMOGLOBIN CONC: 32.9 g/dL (ref 30.0–36.0)
MEAN CORPUSCULAR HEMOGLOBIN: 27.1 pg (ref 26.0–34.0)
MEAN CORPUSCULAR VOLUME: 82.4 fL (ref 81.0–95.0)
MEAN PLATELET VOLUME: 6.8 fL — ABNORMAL LOW (ref 7.0–10.0)
MONOCYTES ABSOLUTE COUNT: 0.2 10*9/L (ref 0.1–1.0)
MONOCYTES RELATIVE PERCENT: 12.2 %
NEUTROPHILS ABSOLUTE COUNT: 1.1 10*9/L — ABNORMAL LOW (ref 1.7–7.7)
NEUTROPHILS RELATIVE PERCENT: 64.3 %
NUCLEATED RED BLOOD CELLS: 0 /100{WBCs} (ref <=4)
PLATELET COUNT: 67 10*9/L — ABNORMAL LOW (ref 150–450)
RED CELL DISTRIBUTION WIDTH: 18.6 % — ABNORMAL HIGH (ref 12.0–15.0)
WBC ADJUSTED: 1.7 10*9/L — ABNORMAL LOW (ref 3.5–10.5)

## 2019-12-22 LAB — MEAN CORPUSCULAR HEMOGLOBIN CONC: Erythrocyte mean corpuscular hemoglobin concentration:MCnc:Pt:RBC:Qn:Automated count: 32.9

## 2019-12-22 MED ADMIN — heparin, porcine (PF) 100 unit/mL injection 500 Units: 500 [IU] | INTRAVENOUS | @ 16:00:00 | Stop: 2019-12-22

## 2019-12-22 NOTE — Unmapped (Signed)
RADIATION TREATMENT MANAGEMENT NOTE     Encounter Date: 12/22/2019  Patient Name: Mark Calhoun  Medical Record Number: [id number]    DIAGNOSIS:  54yo with history of T3N1 sigmoid colon cancer s/p resection and adjuvant chemo in 2019 with anastamotic recurrence in 2020 with L sidewall/abdominal wall nodules.  He is now s/p chemo and excision of all sites of abdominal disease followed by HIPEC.    ASSESSMENT: 3200cGy of planned 5000cGy  Karnofsky/Lansky Performance Status: 80, Normal activity with effort; some signs or symptoms of disease (ECOG equivalent 1)  Chemotherapy/Systemic therapy:administered concurrently (xeloda)  Clinical Trial:   no    RECOMMENDATIONS:  1. Plan for Therapy: Continue treatment as planned.  He is taking xeloda.  Successful ablation of liver lesion with IR last week.  2. GI:  No issues, rare nausea with xeloda  3. GU: No issues  4. Pain: mild LLQ pain- at baseline  5. Follow-up: For status check next week    SUBJECTIVE:   Doing very well today- no new issues.  He still has the intermittent dull LLQ pain that is unchanged.  Adjusted his xeloda dose recently due to some low counts but otherwise he feels fine.  Thinking about going back to work after the completion of RT but may delay this until after his first surveillance scan    PHYSICAL EXAM:  Vital Signs for this encounter:  There were no vitals taken for this visit.  Last weight:    Wt Readings from Last 4 Encounters:   12/16/19 74.8 kg (164 lb 12.8 oz)   12/02/19 75.3 kg (166 lb)   11/11/19 75.5 kg (166 lb 7.2 oz)   11/10/19 76.2 kg (168 lb)     General:  Alert and Orientated X 3.  No acute distress.    Skin: No skin darkening.    I have reviewed the patient's dose delivery, dosimetry, lab tests, patient treatment set-up, port films, treatment parameters and x-rays.    Rayetta Humphrey, MD  Assistant Professor  [facility] Dept of Radiation Oncology  12/22/2019

## 2019-12-22 NOTE — Unmapped (Signed)
Assessment     Symptom and/or toxicity review:  ??? 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 He has noticed improvement in nausea since reducing dose reduction.   ??? Fatigue (Grade 1): Dr. Selena Batten notes moderate fatigue that he attributes to the radiation therapy. He occasionally gets more tired with long walks and reports he is sleeping more.  ??? LLQ pain: bothersome, but stable and at baseline  ??? Labs monitoring (6/23): WBC 1.7; ANC 1.1 (grade 2 decrease); Hgb 10.8 (grade 1 decrease); PLT 67 (grade 2 decrease) - counts remain stable, patient instructed to maintain current dose of capecitabine.   ??? Patient denies concerns with:  o Vomiting  o Diarrhea  o Constipation  o Anorexia  o Hand-foot syndrome  o Mucositis  o Fevers  o Rash    Medication Reconciliation and Drug-Drug Interactions: Medication list reviewed in Epic. Patient informed to notify the team prior to starting any new prescription or over the counter medications or supplements.     Adherence:  ??? Missed doses: 0  ??? Dr. Selena Batten confirms taking new dose of capecitabine 1000 mg (2 tablets) PO BID on M-F radiation days, within 30 minutes of food.    Plan     1. Colon cancer:  o Continue capecitabine 1000 mg (2 tablets) PO BID on M-F radiation days  o CBC with diff with continued Grade 2 ANC/PLT decrease but overall stable - will continue current dose    Patient's questions were answered and patient verbalized understanding of the plan.    Follow up: 12/30/19 office visit with Dr. 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 Of note, capecitabine recently dose reduced from 1500 mg to 1000 mg PO BID due to Grade 2 decrease in PLT and ANC. He reports that he is doing well and experiencing less nausea since reducing the capecitabine dose, but his fatigue has remained stable.    Regimen: Capecitabine   12/01/19- capecitabine 1500 mg PO BID on radiation days (Mon-Fri)  12/16/19- capecitabine 1000 mg PO BID on radiation days (Mon-Fri)       Wt Readings from Last 3 Encounters:   12/16/19 74.8 kg (164 lb 12.8 oz)   12/02/19 75.3 kg (166 lb)   11/11/19 75.5 kg (166 lb 7.2 oz)       Pertinent labs:  Lab Results   Component Value Date    WBC 1.7 (L) 12/22/2019    HGB 10.8 (L) 12/22/2019    HCT 32.6 (L) 12/22/2019    PLT 67 (L) 12/22/2019       Lab Results   Component Value Date    NA 144 12/02/2019    K 3.8 12/02/2019    CL 112 (H) 12/02/2019    CO2 28.4 12/02/2019    BUN 13 12/02/2019    CREATININE 0.85 12/02/2019    GLU 104 12/02/2019    CALCIUM 9.1 12/02/2019    MG 1.4 (L) 10/27/2019    PHOS 5.4 (H) 10/27/2019       Lab Results   Component Value Date    BILITOT 1.4 (H) 12/16/2019    BILIDIR 0.40 (H) 12/16/2019    PROT 6.9 12/16/2019    ALBUMIN 3.7 12/16/2019    ALT 12 12/16/2019    AST 20 12/16/2019    ALKPHOS 59 12/16/2019  Lab Results   Component Value Date    INR 1.16 12/08/2019    APTT 32.5 08/29/2019       Melynda Keller, PharmD Candidate    Ronnald Collum, PharmD  Hematology/Oncology Clinical Pharmacist  Pager [phone]        I spent 10 minutes on the phone with the patient on the date of service. I spent an additional 15 minutes on pre- and post-visit activities on the date of service.     The patient was physically located in [place] or a state in which I am permitted to provide care. The patient and/or parent/guardian understood that s/he may incur co-pays and cost sharing, and agreed to the telemedicine visit. The visit was reasonable and appropriate under the circumstances given the patient's presentation at the time.    The patient and/or parent/guardian has been advised of the potential risks and limitations of this mode of treatment (including, but not limited to, the absence of in-person examination) and has agreed to be treated using telemedicine. The patient's/patient's family's questions regarding telemedicine have been answered.     If the visit was completed in an ambulatory setting, the patient and/or parent/guardian has also been advised to contact their provider???s office for worsening conditions, and seek emergency medical treatment and/or call 911 if the patient deems either necessary.

## 2019-12-22 NOTE — Unmapped (Signed)
12/22/2019    Dose Site Summary:    Subjective/Assessment/Recommendations:    1. Nutrition:  2. G-Tube status:  3. Chemo:  4. Nausea/Vomiting:  5. Bowel elimination:  6. Urinary elimination:  7. Fatigue:  8. Pain:  9. Prescription Needs:  10. Psychosocial:  11. Other:

## 2019-12-22 NOTE — Unmapped (Signed)
Pt's port accessed - flushed, bld return noted, labs drawn and heplocked. Port de-accessed. Pt tolerated well.

## 2019-12-26 MED ORDER — CAPECITABINE 500 MG TABLET
ORAL_TABLET | 0 refills | 0 days | Status: CP
Start: 2019-12-26 — End: ?

## 2019-12-29 DIAGNOSIS — C19 Malignant neoplasm of rectosigmoid junction: Principal | ICD-10-CM

## 2019-12-29 NOTE — Unmapped (Signed)
12/29/2019    Dose Site Summary:  Rx:Lt Abd Sid: 12/29/2019: 4,200/5,000 cGy  Subjective/Assessment/Recommendations:

## 2019-12-29 NOTE — Unmapped (Signed)
Clinical Assessment Needed For: Dose Change  Medication: capecitabine  Last Fill Date: 12/13/19  Copay $0/28 DAYS  Was previous dose already scheduled to fill: No    Notes to Pharmacist:Prescription comments Dose change; no need to fill

## 2019-12-29 NOTE — Unmapped (Signed)
RADIATION TREATMENT MANAGEMENT NOTE     Encounter Date: 12/29/2019  Patient Name: Mark Calhoun  Medical Record Number: [id number]    DIAGNOSIS:  54yo with history of T3N1 sigmoid colon cancer s/p resection and adjuvant chemo in 2019 with anastamotic recurrence in 2020 with L sidewall/abdominal wall nodules.  He is now s/p chemo and excision of all sites of abdominal disease followed by HIPEC.    ASSESSMENT: 4200cGy of planned 5000cGy  Karnofsky/Lansky Performance Status: 80, Normal activity with effort; some signs or symptoms of disease (ECOG equivalent 1)  Chemotherapy/Systemic therapy:administered concurrently (xeloda)  Clinical Trial:   no    RECOMMENDATIONS:  1. Plan for Therapy: Continue treatment as planned.  He is taking xeloda.  Successful ablation of liver lesion with IR last week.  2. GI:  No issues, rare nausea with xeloda  3. GU: No issues  4. Pain: mild LLQ pain- at baseline  5. Follow-up: For status check next week    SUBJECTIVE:   Continues to tolerate treatment well.  A little more tired.  Stable LLQ discomfort, no change in bowels.  Staying fairly active.  No issues with xeloda including issues with hands/feet    PHYSICAL EXAM:  Vital Signs for this encounter:  There were no vitals taken for this visit.  Last weight:    Wt Readings from Last 4 Encounters:   12/16/19 74.8 kg (164 lb 12.8 oz)   12/02/19 75.3 kg (166 lb)   11/11/19 75.5 kg (166 lb 7.2 oz)   11/10/19 76.2 kg (168 lb)     General:  Alert and Orientated X 3.  No acute distress.    Skin: No skin darkening.    I have reviewed the patient's dose delivery, dosimetry, lab tests, patient treatment set-up, port films, treatment parameters and x-rays.    Rayetta Humphrey, MD  Assistant Professor  [facility] Dept of Radiation Oncology  12/29/2019

## 2019-12-30 ENCOUNTER — Ambulatory Visit: Admit: 2019-12-30 | Discharge: 2020-01-29 | Payer: PRIVATE HEALTH INSURANCE

## 2019-12-30 ENCOUNTER — Ambulatory Visit: Admit: 2019-12-30 | Discharge: 2019-12-31 | Payer: PRIVATE HEALTH INSURANCE

## 2019-12-30 ENCOUNTER — Encounter
Admit: 2019-12-30 | Discharge: 2020-01-29 | Payer: PRIVATE HEALTH INSURANCE | Attending: Radiation Oncology | Primary: Radiation Oncology

## 2019-12-30 ENCOUNTER — Institutional Professional Consult (permissible substitution): Admit: 2019-12-30 | Discharge: 2019-12-31 | Payer: PRIVATE HEALTH INSURANCE

## 2019-12-30 ENCOUNTER — Encounter: Admit: 2019-12-30 | Discharge: 2020-01-29 | Payer: PRIVATE HEALTH INSURANCE

## 2019-12-30 ENCOUNTER — Encounter
Admit: 2019-12-30 | Discharge: 2020-01-29 | Payer: PRIVATE HEALTH INSURANCE | Attending: Hematology & Oncology | Primary: Hematology & Oncology

## 2019-12-30 DIAGNOSIS — C187 Malignant neoplasm of sigmoid colon: Principal | ICD-10-CM

## 2019-12-30 DIAGNOSIS — C787 Secondary malignant neoplasm of liver and intrahepatic bile duct: Principal | ICD-10-CM

## 2019-12-30 LAB — COMPREHENSIVE METABOLIC PANEL
ALBUMIN: 3.7 g/dL (ref 3.4–5.0)
ALKALINE PHOSPHATASE: 63 U/L (ref 46–116)
ALT (SGPT): 11 U/L (ref 10–49)
ANION GAP: 5 mmol/L (ref 3–11)
AST (SGOT): 20 U/L (ref <34)
BILIRUBIN TOTAL: 1.2 mg/dL (ref 0.3–1.2)
BLOOD UREA NITROGEN: 13 mg/dL (ref 9–23)
BUN / CREAT RATIO: 16
CALCIUM: 9.3 mg/dL (ref 8.7–10.4)
CHLORIDE: 109 mmol/L — ABNORMAL HIGH (ref 98–107)
CREATININE: 0.82 mg/dL (ref 0.60–1.10)
EGFR CKD-EPI AA MALE: >90 mL/min/{1.73_m2}
EGFR CKD-EPI NON-AA MALE: >90 mL/min/{1.73_m2}
GLUCOSE RANDOM: 112 mg/dL (ref 70–179)
POTASSIUM: 4 mmol/L (ref 3.5–5.1)
PROTEIN TOTAL: 7.1 g/dL (ref 5.7–8.2)
SODIUM: 141 mmol/L (ref 135–145)

## 2019-12-30 LAB — CBC W/ AUTO DIFF
BASOPHILS RELATIVE PERCENT: 0.5 %
EOSINOPHILS ABSOLUTE COUNT: 0.1 10*9/L (ref 0.0–0.7)
EOSINOPHILS RELATIVE PERCENT: 4.7 %
HEMATOCRIT: 33.2 % — ABNORMAL LOW (ref 38.0–50.0)
HEMOGLOBIN: 10.8 g/dL — ABNORMAL LOW (ref 13.5–17.5)
LYMPHOCYTES ABSOLUTE COUNT: 0.2 10*9/L — ABNORMAL LOW (ref 0.7–4.0)
LYMPHOCYTES RELATIVE PERCENT: 15 %
MEAN CORPUSCULAR HEMOGLOBIN CONC: 32.5 g/dL (ref 30.0–36.0)
MEAN CORPUSCULAR HEMOGLOBIN: 26.8 pg (ref 26.0–34.0)
MEAN CORPUSCULAR VOLUME: 82.5 fL (ref 81.0–95.0)
MEAN PLATELET VOLUME: 7.8 fL (ref 7.0–10.0)
MONOCYTES ABSOLUTE COUNT: 0.2 10*9/L (ref 0.1–1.0)
MONOCYTES RELATIVE PERCENT: 13.9 %
NEUTROPHILS ABSOLUTE COUNT: 0.9 10*9/L — ABNORMAL LOW (ref 1.7–7.7)
NEUTROPHILS RELATIVE PERCENT: 65.9 %
PLATELET COUNT: 64 10*9/L — ABNORMAL LOW (ref 150–450)
RED CELL DISTRIBUTION WIDTH: 19.9 % — ABNORMAL HIGH (ref 12.0–15.0)
WBC ADJUSTED: 1.3 10*9/L — CL (ref 3.5–10.5)

## 2019-12-30 LAB — HEMATOCRIT: Hematocrit:VFr:Pt:Bld:Qn:: 33.2 — ABNORMAL LOW

## 2019-12-30 LAB — EGFR CKD-EPI NON-AA MALE
Glomerular filtration rate/1.73 sq M.predicted.non black:ArVRat:Pt:Ser/Plas/Bld:Qn:Creatinine-based formula (CKD-EPI): >90

## 2019-12-30 NOTE — Unmapped (Signed)
Summary from today:  MRI and CT in 6 weeks    For health related questions call:    For appointments & questions Monday through Friday 8 AM??? 5 PM   please call [phone] or Toll free [phone].    On Nights, Weekends and Holidays  Call [phone] and ask for the oncologist on call.    Your GI Medical OncologyTreatment Team    Doctor: [place]. Forbes Cellar, MD  Nurse Practitioner: Eula Fried, ANP  Nurse Navigator: Ted Mcalpine, RN  Clinical Pharmacist: Konrad Penta, PharmD  Scheduling Administrator: Ms. Roanna Banning

## 2019-12-30 NOTE — Unmapped (Signed)
[facility]     PRIMARY CARE PROVIDER:  Carlynn Herald, MD  [address]  [place] [address]    CONSULTING PROVIDERS  Dr. Nemiah Commander, [facility] Colorectal Surgery   Dr. Reymundo Poll, [facility] Surgical Oncology  Dr. Rayetta Humphrey, [facility] Radiation Oncology  ____________________________________________________________________    CANCER HISTORY  Diagnosis 03/2018 stage III colon cancer presenting with severe anemia   1. 03/17/18 lap left/sigmoid colectomy: pT3N1b (3/42) moderately differentiated adenocarcinoma, discontig tumor nodules present, intermediate tumor budding,  IHC intact for MMR enzymes (MLH1, MSH2, MSH6, PMS2). preop CEA 4.2   2. 04/09/18-08/2018: CapeOX x 8, final cycle capecitabine only   3. 04/2019 surveillance detected local recurrence   4. 05/06/19-07/2019 FOLFIRI  5. 08/18/19 ex lap, partial proctocolectomy with unblock pelvic sidewal resection, omentectomy, appendectomy, HIPEC with mitomycin c; diverting loop ileostomy  6. 10/26/2019 ileostomy takedown  7. 12/01/2019-01/05/2020 ongoing chemoradiotherapy with capecitabine to left pelvis sidewall (planned 5000 cGy)    UGT1A1 *1/28     Tumor Molecular Genetics:  Tempus on primary, confirmed on repeat of recurrence;   TP53 mutation  KRAS/NRAS/BRAF WT    TREATMENT PLAN:  Pelvic sidewall RT +  ablation liver met.   ____________________________________________________________________    ASSESSMENT  1. Metastatic, RAS/RAF WT, pMMR sigmoid colon cancer   2. Multifactorial pancytopenias  3. 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 postMRI nausea,possibly related to gad  6. ECOG PS 1    RECOMMENDATIONS  1.cont RT to pelvic sidewall. + radiosensitizing cape,  1000 mg BID  2.  RTC for visit with me  ~6 weeks post CRT CT chest and MRI abd    DISCUSSION  Clinically doing great, minimal AEs. No dosing changes. Will get repeat postRx imaging in ~6 weeks, which is a few months from his last, then plan to go to q3 month imaging for next period, which will space out if he remains disease free.  ______________________________________________________________________  HISTORY     Mark Calhoun is seen today at the [facility] for ongoing management regarding colon cancer    Good treatment tolerance. Eating well. Still walking but not as much because too hot for later in the day walks. ~3-4 bm/day but are formed. Same LLQ ache.       MEDICAL HISTORY  Interval: none  Prior:   1. Gout-  2. 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 His brother Mark Calhoun is my partner in medical oncology. Mark Calhoun is married Mark Calhoun) and has two teenage daughters (17 and 65 as of 03/2018).      PHYSICAL EXAM  BP 164/94  - Pulse 68  - Temp 35.8 ??C (96.4 ??F) (Temporal)  - Ht 180.3 cm (5' 10.98)  - Wt 75.1 kg (165 lb 9.1 oz)  - SpO2 99%  - BMI 23.10 kg/m??    GENERAL: well developed, well nourished, in no distress  HEENT: NCAT, pupils equal, sclerae anicteric, PSYCH: full and appropriate range of affect with good insight and judgement  Skin: no handfoot     OBJECTIVE DATA  Labs reviewed in emr    mild leukopenia and tpenia, increased    RADIOLOGY RESULTS   none

## 2020-01-05 DIAGNOSIS — C19 Malignant neoplasm of rectosigmoid junction: Principal | ICD-10-CM

## 2020-01-05 NOTE — Unmapped (Signed)
RADIATION TREATMENT MANAGEMENT NOTE     Encounter Date: 01/05/2020  Patient Name: Mark Calhoun  Medical Record Number: [id number]    DIAGNOSIS:  54yo with history of T3N1 sigmoid colon cancer s/p resection and adjuvant chemo in 2019 with anastamotic recurrence in 2020 with L sidewall/abdominal wall nodules.  He is now s/p chemo and excision of all sites of abdominal disease followed by HIPEC.    ASSESSMENT: 5000cGy of planned 5000cGy  Karnofsky/Lansky Performance Status: 80, Normal activity with effort; some signs or symptoms of disease (ECOG equivalent 1)  Chemotherapy/Systemic therapy:administered concurrently (xeloda)  Clinical Trial:   no    RECOMMENDATIONS:  1. Plan for Therapy: Continue treatment as planned.  He is taking xeloda.  Successful ablation of liver lesion with IR durin treatment.  2. GI:  No issues, rare nausea with xeloda  3. GU: No issues  4. Pain: mild LLQ pain- at baseline  5. Follow-up: He will see med onc next month with scans.  He has tolerated treatment well and will defer rad onc follow-up at this time to minimize treatment visits.  Dr. Selena Batten has rad onc contact information if he were to develop any symptoms.    SUBJECTIVE:   He is tolerating treatment well- had some diarrhea the other day but nothing since then.  Stable LLQ pain.  No skin changes.  Walking 3-4 miles/day.  Overall he has tolerated treatment very well.    PHYSICAL EXAM:  Vital Signs for this encounter:  There were no vitals taken for this visit.  Last weight:    Wt Readings from Last 4 Encounters:   12/30/19 75.1 kg (165 lb 9.1 oz)   12/16/19 74.8 kg (164 lb 12.8 oz)   12/02/19 75.3 kg (166 lb)   11/11/19 75.5 kg (166 lb 7.2 oz)     General:  Alert and Orientated X 3.  No acute distress.    Skin: No skin darkening.    I have reviewed the patient's dose delivery, dosimetry, lab tests, patient treatment set-up, port films, treatment parameters and x-rays.    Rayetta Humphrey, MD  Assistant Professor  [facility] Dept of Radiation Oncology  01/05/2020

## 2020-01-05 NOTE — Unmapped (Signed)
.  h 

## 2020-01-07 NOTE — Unmapped (Signed)
RADIATION ONCOLOGY TREATMENT COMPLETION NOTE    Encounter Date: 01/05/2020  Patient Name: Mark Calhoun  Medical Record Number: [id number]    Referring Physician: No referring provider defined for this encounter.    Primary Care Provider: Carlynn Herald, MD    DIAGNOSIS:  54yo with history of T3N1 sigmoid colon cancer s/p resection and adjuvant chemo in 2019 with anastamotic recurrence in 2020 with L sidewall/abdominal wall nodules. ??He is now s/p chemo and excision of all sites of abdominal disease followed by HIPEC.    TREATMENT INTENT: curative    CLINICAL TRIAL: no    CHEMOTHERAPY: administered concurrently    RADIATION TREATMENT SUMMARY:          Treatment site Treatment Technique/Modality Energy Dose per fraction Total number  of fractions Total dose Start date End date   L pelvic/ abdominal sidewall IMRT  6 MV 200 cGy 25 5000 cGy 11/30/2019   01/05/2020             COMPLETED INTENDED COURSE:  Yes    TREATMENT BREAK > 2 WEEKS:  No    TOLERANCE TO TREATMENT:  No toxicities or complications    FEEDING TUBE:  no    PLAN FOR FOLLOW-UP: Mark Calhoun is to return for follow up with me/our group as needed.  He will continue to follow with medical oncology    Genia Plants, MD  01/07/20 3:35 PM

## 2020-01-26 NOTE — Unmapped (Signed)
This patient has been disenrolled from the [facility] specialty pharmacy services due to therapy completion - expected therapy completion date: Completed 5.5 weeks of CRT with Capecitabine.    Kermit Balo  [facility] Specialty Pharmacist

## 2020-02-09 ENCOUNTER — Ambulatory Visit: Admit: 2020-02-09 | Discharge: 2020-02-10 | Payer: PRIVATE HEALTH INSURANCE

## 2020-02-14 ENCOUNTER — Encounter: Admit: 2020-02-14 | Discharge: 2020-02-15 | Payer: PRIVATE HEALTH INSURANCE

## 2020-02-14 ENCOUNTER — Ambulatory Visit: Admit: 2020-02-14 | Discharge: 2020-02-15 | Payer: PRIVATE HEALTH INSURANCE

## 2020-02-17 ENCOUNTER — Ambulatory Visit
Admit: 2020-02-17 | Discharge: 2020-02-18 | Payer: PRIVATE HEALTH INSURANCE | Attending: Hematology & Oncology | Primary: Hematology & Oncology

## 2020-02-17 ENCOUNTER — Institutional Professional Consult (permissible substitution): Admit: 2020-02-17 | Discharge: 2020-02-17 | Payer: PRIVATE HEALTH INSURANCE

## 2020-02-17 DIAGNOSIS — C187 Malignant neoplasm of sigmoid colon: Principal | ICD-10-CM

## 2020-02-17 DIAGNOSIS — C787 Secondary malignant neoplasm of liver and intrahepatic bile duct: Principal | ICD-10-CM

## 2020-03-28 ENCOUNTER — Ambulatory Visit: Admit: 2020-03-28 | Discharge: 2020-03-29 | Payer: PRIVATE HEALTH INSURANCE

## 2020-03-31 ENCOUNTER — Encounter
Admit: 2020-03-31 | Discharge: 2020-04-01 | Payer: PRIVATE HEALTH INSURANCE | Attending: Student in an Organized Health Care Education/Training Program | Primary: Student in an Organized Health Care Education/Training Program

## 2020-03-31 DIAGNOSIS — C189 Malignant neoplasm of colon, unspecified: Principal | ICD-10-CM

## 2020-03-31 DIAGNOSIS — I10 Essential (primary) hypertension: Principal | ICD-10-CM

## 2020-03-31 DIAGNOSIS — D61810 Antineoplastic chemotherapy induced pancytopenia: Principal | ICD-10-CM

## 2020-03-31 DIAGNOSIS — C787 Secondary malignant neoplasm of liver and intrahepatic bile duct: Secondary | ICD-10-CM

## 2020-03-31 DIAGNOSIS — M109 Gout, unspecified: Principal | ICD-10-CM

## 2020-03-31 MED ORDER — COLCHICINE 0.6 MG CAPSULE
ORAL_CAPSULE | ORAL | 3 refills | 0.00000 days | Status: CP
Start: 2020-03-31 — End: ?

## 2020-03-31 MED ORDER — AMLODIPINE 5 MG TABLET
ORAL_TABLET | Freq: Every day | ORAL | 3 refills | 90.00000 days | Status: CP
Start: 2020-03-31 — End: 2021-03-31

## 2020-04-20 ENCOUNTER — Ambulatory Visit: Admit: 2020-04-20 | Discharge: 2020-04-21 | Payer: PRIVATE HEALTH INSURANCE

## 2020-05-15 DIAGNOSIS — C187 Malignant neoplasm of sigmoid colon: Principal | ICD-10-CM

## 2020-05-16 ENCOUNTER — Institutional Professional Consult (permissible substitution): Admit: 2020-05-16 | Discharge: 2020-05-17 | Payer: PRIVATE HEALTH INSURANCE

## 2020-05-16 ENCOUNTER — Ambulatory Visit: Admit: 2020-05-16 | Discharge: 2020-05-17 | Payer: PRIVATE HEALTH INSURANCE

## 2020-05-16 DIAGNOSIS — C187 Malignant neoplasm of sigmoid colon: Principal | ICD-10-CM

## 2020-05-18 ENCOUNTER — Ambulatory Visit
Admit: 2020-05-18 | Discharge: 2020-05-18 | Payer: PRIVATE HEALTH INSURANCE | Attending: Hematology & Oncology | Primary: Hematology & Oncology

## 2020-05-18 DIAGNOSIS — R161 Splenomegaly, not elsewhere classified: Principal | ICD-10-CM

## 2020-05-18 DIAGNOSIS — D61810 Antineoplastic chemotherapy induced pancytopenia: Principal | ICD-10-CM

## 2020-05-18 DIAGNOSIS — C187 Malignant neoplasm of sigmoid colon: Principal | ICD-10-CM

## 2020-05-18 DIAGNOSIS — R1032 Left lower quadrant pain: Principal | ICD-10-CM

## 2020-05-18 DIAGNOSIS — D72819 Decreased white blood cell count, unspecified: Principal | ICD-10-CM

## 2020-05-18 DIAGNOSIS — T451X5A Adverse effect of antineoplastic and immunosuppressive drugs, initial encounter: Principal | ICD-10-CM

## 2020-05-18 DIAGNOSIS — D61818 Other pancytopenia: Principal | ICD-10-CM

## 2020-05-29 ENCOUNTER — Ambulatory Visit: Admit: 2020-05-29 | Discharge: 2020-05-30 | Payer: PRIVATE HEALTH INSURANCE

## 2020-05-29 DIAGNOSIS — M7989 Other specified soft tissue disorders: Principal | ICD-10-CM

## 2020-05-29 DIAGNOSIS — R911 Solitary pulmonary nodule: Principal | ICD-10-CM

## 2020-05-29 DIAGNOSIS — K8020 Calculus of gallbladder without cholecystitis without obstruction: Principal | ICD-10-CM

## 2020-05-29 DIAGNOSIS — C187 Malignant neoplasm of sigmoid colon: Principal | ICD-10-CM

## 2020-05-29 DIAGNOSIS — R161 Splenomegaly, not elsewhere classified: Principal | ICD-10-CM

## 2020-06-01 DIAGNOSIS — D739 Disease of spleen, unspecified: Principal | ICD-10-CM

## 2020-06-01 DIAGNOSIS — D61810 Antineoplastic chemotherapy induced pancytopenia: Principal | ICD-10-CM

## 2020-06-01 DIAGNOSIS — C187 Malignant neoplasm of sigmoid colon: Principal | ICD-10-CM

## 2020-06-01 DIAGNOSIS — T451X5A Adverse effect of antineoplastic and immunosuppressive drugs, initial encounter: Principal | ICD-10-CM

## 2020-06-01 DIAGNOSIS — D61818 Other pancytopenia: Principal | ICD-10-CM

## 2020-06-01 DIAGNOSIS — C800 Disseminated malignant neoplasm, unspecified: Principal | ICD-10-CM

## 2020-06-01 MED ORDER — ROMIPLOSTIM 125 MCG SUBCUTANEOUS SOLUTION
SUBCUTANEOUS | 11 refills | 0.00000 days | Status: CP
Start: 2020-06-01 — End: 2020-06-05

## 2020-06-01 MED ORDER — DOXYCYCLINE MONOHYDRATE 100 MG CAPSULE: 100 mg | capsule | Freq: Two times a day (BID) | 5 refills | 30 days | Status: AC

## 2020-06-01 MED ORDER — LOPERAMIDE 2 MG CAPSULE: capsule | 11 refills | 0 days | Status: AC

## 2020-06-02 DIAGNOSIS — C800 Disseminated malignant neoplasm, unspecified: Principal | ICD-10-CM

## 2020-06-02 DIAGNOSIS — C187 Malignant neoplasm of sigmoid colon: Principal | ICD-10-CM

## 2020-06-07 DIAGNOSIS — C187 Malignant neoplasm of sigmoid colon: Principal | ICD-10-CM

## 2020-06-09 DIAGNOSIS — C187 Malignant neoplasm of sigmoid colon: Principal | ICD-10-CM

## 2020-06-09 DIAGNOSIS — Z5111 Encounter for antineoplastic chemotherapy: Principal | ICD-10-CM

## 2020-06-09 MED ORDER — PEGFILGRASTIM-BMEZ 6 MG/0.6 ML SUBCUTANEOUS SYRINGE
SUBCUTANEOUS | 5 refills | 0.00000 days | Status: CP
Start: 2020-06-09 — End: 2020-07-11
  Filled 2020-06-14: qty 1.2, 28d supply, fill #0

## 2020-06-12 DIAGNOSIS — C187 Malignant neoplasm of sigmoid colon: Principal | ICD-10-CM

## 2020-06-13 NOTE — Unmapped (Signed)
[facility] Specialty Medication Onboarding    Specialty Medication: Ziextenzo 6 mg/0.6 ml syringe  Prior Authorization: Approved   Financial Assistance: Yes - copay card approved as secondary   Final Copay/Day Supply: $0.00 / 28 day supply    Insurance Restrictions: None     Notes to Pharmacist: Doxycycline $22.64, Loperamide $23.55 (not covered), Ondansetron $6.36, Prochlorperazine $19.24    The triage team has completed the benefits investigation and has determined that the patient is able to fill this medication at [facility]. Please contact the patient to complete the onboarding or follow up with the prescribing physician as needed.

## 2020-06-14 DIAGNOSIS — C187 Malignant neoplasm of sigmoid colon: Principal | ICD-10-CM

## 2020-06-14 DIAGNOSIS — C800 Disseminated malignant neoplasm, unspecified: Principal | ICD-10-CM

## 2020-06-14 MED ORDER — WHITE PETROLATUM-MINERAL OIL TOPICAL CREAM
Freq: Two times a day (BID) | TOPICAL | 6 refills | 0 days
Start: 2020-06-14 — End: ?

## 2020-06-14 MED ORDER — ONDANSETRON HCL 8 MG TABLET
ORAL_TABLET | Freq: Three times a day (TID) | ORAL | 2 refills | 10.00000 days | Status: CP | PRN
Start: 2020-06-14 — End: 2020-06-14

## 2020-06-14 MED ORDER — LOPERAMIDE 2 MG CAPSULE
ORAL_CAPSULE | 11 refills | 0.00000 days | Status: CP
Start: 2020-06-14 — End: 2020-06-14

## 2020-06-14 MED ORDER — PROCHLORPERAZINE MALEATE 10 MG TABLET: 10 mg | tablet | Freq: Four times a day (QID) | 2 refills | 8 days | Status: AC

## 2020-06-14 MED ORDER — DOXYCYCLINE MONOHYDRATE 100 MG CAPSULE
ORAL_CAPSULE | Freq: Two times a day (BID) | ORAL | 5 refills | 30.00000 days | Status: CP
Start: 2020-06-14 — End: 2020-06-14

## 2020-06-14 MED ORDER — ONDANSETRON HCL 8 MG TABLET: 8 mg | tablet | Freq: Three times a day (TID) | 2 refills | 10 days | Status: AC

## 2020-06-14 MED ORDER — PROCHLORPERAZINE MALEATE 10 MG TABLET
ORAL_TABLET | Freq: Four times a day (QID) | ORAL | 2 refills | 8.00000 days | Status: CP | PRN
Start: 2020-06-14 — End: 2020-06-14

## 2020-06-14 MED FILL — ZIEXTENZO 6 MG/0.6 ML SUBCUTANEOUS SYRINGE: 28 days supply | Qty: 1 | Fill #0 | Status: AC

## 2020-06-14 NOTE — Unmapped (Signed)
Pharmacist First Cycle Chemotherapy Education     Mark Calhoun is a 54 y.o. male with colon cancer who I have counseled prior to the initiation of irinotecan and panitumumab.     Side effects discussed included but were not limited to:  ?? Nail/skin changes (including acneiform rash)  ?? Electrolyte abnormalities   ?? Infusion-related side effects/allergic reactions  ?? Nausea  ?? Diarrhea (early and late)  ?? Fatigue  ?? Taste changes  ?? Complications associated with myelosuppression  ?? Alopecia  ?? Mucositis  ?? Constipation  ?? Reproductive concerns    Supportive care:  ?? Patient was instructed to use fragrance-free moisturizer cream/ointment (e.g. Eucerin(R) Original Healing Cream, Aquaphor(R) Healing Ointment, or Cetaphil(R) Moisturizing Cream) twice daily applied to the face, hands, feet, neck, back and chest, as well as use of sunscreen SPF >/= 30 prior to going out in the sun.  ?? Doxycycline 100 mg PO BID with food (remain upright for 30 minutes after taking to minimize risk for esophageal irritation) discussed for prevention/management of acneiform rash. Start on cycle 1 day 1.  Reviewed directions for: nausea treatment/prevention ondansetron and prochlorperazine as well as directions for use of loperamide for chemotherapy-related diarrhea. Prescriptions routed to patient's pharmacy.  For mucositis prevention, recommended preventative salt/baking soda rinses (swish and spit) QID after meals and at bedtime (one-time use mixture of 1/4 teaspoon salt and 1/4 teaspoon baking soda in 8 ounces of water can be prepared).  ?? Reviewed plan for Ziextenzo administration (6 mg (0.6 mL) subcutaneous every 14 days to be given 24 hours after the completion of chemotherapy). Plan for self-administration at home and patient confirmed understanding (previously self-administered pegfilgrastim biosimilar product). Subsequently confirmed with [facility] that patient's initial delivery has been coordinated for 06/15/20.  ?? Reviewed plan for weekly Nplate given baseline thrombocytopenia. This will be administered in clinic.    The patient verbalized understanding of this information.    Handout provided: [place] Patient Education Handout    Konrad Penta, PharmD, BCOP, CPP  Clinical Pharmacist Practitioner, Gastrointestinal Oncology        I spent 23 minutes on the phone with the patient on the date of service. I spent an additional 15 minutes on pre- and post-visit activities on the date of service.     The patient was physically located in [place] or a state in which I am permitted to provide care. The patient and/or parent/guardian understood that s/he may incur co-pays and cost sharing, and agreed to the telemedicine visit. The visit was reasonable and appropriate under the circumstances given the patient's presentation at the time.    The patient and/or parent/guardian has been advised of the potential risks and limitations of this mode of treatment (including, but not limited to, the absence of in-person examination) and has agreed to be treated using telemedicine. The patient's/patient's family's questions regarding telemedicine have been answered.     If the visit was completed in an ambulatory setting, the patient and/or parent/guardian has also been advised to contact their provider???s office for worsening conditions, and seek emergency medical treatment and/or call 911 if the patient deems either necessary.

## 2020-06-14 NOTE — Unmapped (Signed)
Patient Education        pegfilgrastim  Pronunciation:  PEG fil GRAS tim  Brand:  Fulphila, Neulasta, Neulasta Onpro Kit, Nyvepria, Udenyca, Ziextenzo  What is the most important information I should know about pegfilgrastim?  Follow all directions on your medicine label and package. Tell each of your healthcare providers about all your medical conditions, allergies, and all medicines you use.  What is pegfilgrastim?  Pegfilgrastim is used to prevent neutropenia (a lack of certain white blood cells) that is caused by receiving chemotherapy.  Pegfilgrastim is a form of a protein that stimulates the growth of white blood cells that help your body fight against infection.  Pegfilgrastim may also be used for purposes not listed in this medication guide.  What should I discuss with my healthcare provider before using pegfilgrastim?  You should not use this medicine if you are allergic to pegfilgrastim or filgrastim (Neupogen).  Tell your doctor if you have ever had:  ?? sickle cell disorder;  ?? kidney disease;  ?? chronic myeloid leukemia;  ?? radiation treatment;  ?? myelodysplastic syndrome (also called preleukemia); or  ?? a latex allergy.  Treatment with Fulphila and radiation with or without chemotherapy for breast or lung cancer, you could develop a blood disorder or blood cancer (leukemia). Ask your doctor about this risk.  Tell your doctor if you are pregnant or breastfeeding.  How should I use pegfilgrastim?  Follow all directions on your prescription label and read all medication guides or instruction sheets. Use the medicine exactly as directed.  Pegfilgrastim is injected under the skin. A healthcare provider may teach you how to properly use the medication by yourself.  Read and carefully follow any Instructions for Use provided with your medicine. Ask your doctor or pharmacist if you don't understand all instructions.  Prepare your injection only when you are ready to give it. Do not use if the medicine looks cloudy, has changed colors, or has particles in it. Call your pharmacist for new medicine.  Carefully follow your doctor's instructions when giving pegfilgrastim to a child who weighs less than 99 pounds (45 kilograms). The correct dose for a child this size cannot be accurately measured using the prefilled syringe.  You may need medical tests to help your doctor determine how long to treat you with pegfilgrastim.  Store the prefilled syringe in its original package in the refrigerator, protected from light. Do not shake or freeze.   Take the syringe out of the refrigerator and let it reach room temperature for 15 to 30 minutes before injecting your dose. If a syringe has become frozen, thaw it in a refrigerator. Do not use any syringe that has been frozen more than one time.  Do not use a Neulasta or Udenyca syringe that has been left at room temperature for longer than 48 hours. Do not use a Fulphila or Ziextenzo syringe that has been left at room temperature for longer than 72 hours.  The Neulasta Onpro Injector is a special device placed on the skin that delivers your pegfilgrastim dose at a specific time. You will need to wear the device for 27 hours before the dose begins. The timed dose will then be released from the device slowly over a 45-minute period.  Keep Neulasta Onpro refrigerated until you are ready to wear it. Do not use an Onpro device that has been left out of a refrigerator for longer than 12 hours.  While wearing Neulasta Onpro, you or a caregiver will need  to check the device to make sure it is working properly.  Each prefilled syringe or Onpro Injector is for one use only. Throw it away after one use, even if there is still medicine left inside.  Use a needle and syringe only once and then place them in a puncture-proof sharps container. Follow state or local laws about how to dispose of this container. Keep it out of the reach of children and pets.  What happens if I miss a dose?  Call your doctor for instructions if you miss an injection, or if you have a problem with the Neulasta Onpro device.  What happens if I overdose?  Seek emergency medical attention or call the Poison Help line at [phone].  What should I avoid while using pegfilgrastim?  When using Neulasta Onpro: Avoid traveling, driving, or operating machinery while wearing the device.  What are the possible side effects of pegfilgrastim?  Get emergency medical help if you have signs of an allergic reaction:  hives, skin rash, sweating, warmth or tingly feeling; dizziness, fast heartbeats; wheezing, difficulty breathing; swelling of your face, lips, tongue, or throat.  Capillary leak syndrome is a rare but serious side effect of pegfilgrastim. Call your doctor right away if you have signs of this condition, which may include: decreased urination, tiredness, dizziness or light-headed feeling, trouble breathing, and sudden swelling, puffiness, or feeling of fullness.  Call your doctor at once if you have:  ?? sudden or severe pain in your left upper stomach spreading up to your shoulder;  ?? sudden and severe pain in your chest, stomach, or back;  ?? severe or ongoing pain anywhere in your body;  ?? fever, tiredness;  ?? shortness of breath, rapid breathing;  ?? pale skin, easy bruising, unusual bleeding;  ?? bruising, swelling, or a hard lump where the medicine was injected; or  ?? kidney problems --little or no urination, pink or dark urine, swelling in your face or lower legs.  Your cancer treatments may be delayed or permanently discontinued if you have certain side effects.  Common side effects may include:  ?? bone pain; or  ?? pain in your arms or legs.  This is not a complete list of side effects and others may occur. Call your doctor for medical advice about side effects. You may report side effects to FDA at 1-800-FDA-1088.  What other drugs will affect pegfilgrastim?  Other drugs may affect pegfilgrastim, including prescription and over-the-counter medicines, vitamins, and herbal products. Tell your doctor about all your current medicines and any medicine you start or stop using.  Where can I get more information?  Your doctor or pharmacist can provide more information about pegfilgrastim.  Remember, keep this and all other medicines out of the reach of children, never share your medicines with others, and use this medication only for the indication prescribed.   Every effort has been made to ensure that the information provided by [employer] ('[employer]') is accurate, up-to-date, and complete, but no guarantee is made to that effect. Drug information contained herein may be time sensitive. [employer] information has been compiled for use by healthcare practitioners and consumers in the [place] and therefore [employer] does not warrant that uses outside of the [place] are appropriate, unless specifically indicated otherwise. [employer]'s drug information does not endorse drugs, diagnose patients or recommend therapy. [employer]'s drug information is an Armed forces technical officer designed to assist licensed healthcare practitioners in caring for their patients and/or to serve consumers viewing this service  as a supplement to, and not a substitute for, the expertise, skill, knowledge and judgment of healthcare practitioners. The absence of a warning for a given drug or drug combination in no way should be construed to indicate that the drug or drug combination is safe, effective or appropriate for any given patient. [employer] does not assume any responsibility for any aspect of healthcare administered with the aid of information [employer] provides. The information contained herein is not intended to cover all possible uses, directions, precautions, warnings, drug interactions, allergic reactions, or adverse effects. If you have questions about the drugs you are taking, check with your doctor, nurse or pharmacist.  Copyright [phone] [employer] Version: 9.01. Revision date: 09/27/2019.  Care instructions adapted under license by [facility]. If you have questions about a medical condition or this instruction, always ask your healthcare professional. [employer] disclaims any warranty or liability for your use of this information.       Patient Education        romiplostim  Pronunciation:  ROM i PLOS tim  Brand:  Nplate  What is the most important information I should know about romiplostim?  Using romiplostim may increase your risk of developing blood cancers, especially if you have a myelodysplastic syndrome (sometimes called preleukemia).  Romiplostim may also increase your risk of a blood clot or stroke. Call your doctor right away if you have signs of a blood clot, such as sudden numbness or weakness on one side of the body, chest pain, problems with speech or balance, warmth or swelling in an arm or leg.  You will need frequent blood tests and your treatment with romiplostim may be delayed or stopped based on the results.  What is romiplostim?  Romiplostim is used to prevent bleeding episodes in people with chronic immune thrombocytopenic purpura (ITP), a bleeding condition caused by a lack of platelets in the blood.  Romiplostim is for use in adults and children who are at least 59 year old.  Romiplostim is not a cure for ITP  and it will not make your platelet counts normal if you have this condition.  Romiplostim is usually given after other treatments have failed.  Romiplostim may also be used for purposes not listed in this medication guide.  What should I discuss with my healthcare provider before receiving romiplostim?  You should not use romiplostim if you are allergic to it.  Using romiplostim may increase your risk of developing blood cancers, especially if you have a myelodysplastic syndrome (bone marrow failure disorder, sometimes called preleukemia). Talk with your doctor if you have concerns about this risk.  Tell your doctor if you have ever had:  ?? blood or bone marrow cancer such as leukemia, or myelodysplastic syndrome;  ?? bleeding problems or a blood clot;  ?? surgery to remove your spleen; or  ?? liver disease.  It is not known whether this medicine will harm an unborn baby. Tell your doctor if you are pregnant or plan to become pregnant.  If you are pregnant, your name may be listed on a pregnancy registry to track the effects of romiplostim on the baby.  You should not breastfeed while using romiplostim.  How is romiplostim given?  Romiplostim is injected under the skin, usually once per week. A healthcare provider will give you this injection.  It may take up to 4 weeks of before romiplostim is completely effective in preventing bleeding episodes. Keep receiving the medication as directed. Talk with your doctor if  you have any bruising or bleeding episodes after 4 weeks of treatment.  Using romiplostim long-term can cause harmful effects on your bone marrow that may result in serious blood cell disorders. You will need frequent blood tests. Your treatment with romiplostim may be delayed or permanently discontinued based on the results.  After you stop using romiplostim, your risk of bleeding may be even higher than it was before you started treatment. Be extra careful to avoid cuts or injury for at least 2 weeks after you stop using romiplostim. Your blood will need to be tested weekly during this time.  What happens if I miss a dose?  Call your doctor for instructions if you miss an appointment for your romiplostim injection.  What happens if I overdose?  Seek emergency medical attention or call the Poison Help line at [phone].  What should I avoid while receiving romiplostim?  Avoid activities that may increase your risk of bleeding or injury. Use extra care to prevent bleeding while shaving or brushing your teeth.  What are the possible side effects of romiplostim?  Get emergency medical help if you have signs of an allergic reaction: hives; difficult breathing; swelling of your face, lips, tongue, or throat.  Call your doctor at once if you have:  ?? severe or ongoing diarrhea;  ?? purple or red spots under your skin;  ?? signs of an ear infection (more common in children) --fever, ear pain or full feeling, trouble hearing, drainage from the ear, fussiness in a child;  ?? signs of a stroke --sudden numbness or weakness (especially on one side of the body), severe headache, slurred speech, balance problems;  ?? signs of a blood clot in the lung --chest pain, sudden cough, wheezing, rapid breathing, coughing up blood; or  ?? signs of a blood clot in your leg --swelling, warmth, or redness in an arm or leg.  Common side effects may include:  ?? bruising;  ?? headache;  ?? dizziness;  ?? joint pain, muscle weakness or tenderness;  ?? pain in your arms, legs, or shoulder;  ?? numbness or tingling in your hands or feet;  ?? trouble sleeping;  ?? stomach pain, indigestion, nausea, vomiting, diarrhea;  ?? cough, wheezing, chest tightness, trouble breathing;  ?? stuffy nose, sneezing, sinus pain; or  ?? pain in your mouth and throat.  This is not a complete list of side effects and others may occur. Call your doctor for medical advice about side effects. You may report side effects to FDA at 1-800-FDA-1088.  What other drugs will affect romiplostim?  Other drugs may affect romiplostim, including prescription and over-the-counter medicines, vitamins, and herbal products. Tell your doctor about all your current medicines and any medicine you start or stop using.  Where can I get more information?  Your doctor or pharmacist can provide more information about romiplostim.  Remember, keep this and all other medicines out of the reach of children, never share your medicines with others, and use this medication only for the indication prescribed.   Every effort has been made to ensure that the information provided by [employer] ('[employer]') is accurate, up-to-date, and complete, but no guarantee is made to that effect. Drug information contained herein may be time sensitive. [employer] information has been compiled for use by healthcare practitioners and consumers in the [place] and therefore [employer] does not warrant that uses outside of the [place] are appropriate, unless specifically indicated otherwise. [employer]'s drug information does not endorse drugs, diagnose  patients or recommend therapy. [employer]'s drug information is an Investment [employer], corporate to assist licensed healthcare practitioners in caring for their patients and/or to serve consumers viewing this service as a supplement to, and not a substitute for, the expertise, skill, knowledge and judgment of healthcare practitioners. The absence of a warning for a given drug or drug combination in no way should be construed to indicate that the drug or drug combination is safe, effective or appropriate for any given patient. [employer] does not assume any responsibility for any aspect of healthcare administered with the aid of information [employer] provides. The information contained herein is not intended to cover all possible uses, directions, precautions, warnings, drug interactions, allergic reactions, or adverse effects. If you have questions about the drugs you are taking, check with your doctor, nurse or pharmacist.  Copyright [phone] [employer] Version: 7.01. Revision date: 08/02/2019.  Care instructions adapted under license by [facility]. If you have questions about a medical condition or this instruction, always ask your healthcare professional. [employer] disclaims any warranty or liability for your use of this information.

## 2020-06-14 NOTE — Unmapped (Signed)
[facility]   Patient Onboarding/Medication Counseling    Mark Calhoun is a 54 y.o. male with colon cancer who I am counseling today on initiation of therapy.  I am speaking to the patient.    Was a Nurse, learning disability used for this call? No    Verified patient's date of birth / HIPAA.    Specialty medication(s) to be sent: Hematology/Oncology: Ziextenzo      Non-specialty medications/supplies to be sent: n/a      Medications not needed at this time: n/a       The patient declined counseling on medication administration, missed dose instructions, goals of therapy, side effects and monitoring parameters, warnings and precautions, drug/food interactions and storage, handling precautions, and disposal because they were counseled in clinic. The information in the declined sections below are for informational purposes only and was not discussed with patient.       Ziextenzo (pegfilgrastim)    Medication & Administration     Dosage: 1 syringe (6 mg) under the skin once every 14 days (administer 24 hours after the completion of chemotherapy).    Administration: Inject under the skin of the thigh, abdomen, buttocks or upper arm. Rotate sites with each injection.  ??? Injection instructions   o Take 1 syringe out of the refrigerator and allow to stand at room temperature for at least 15-30 minutes  o Wash hands and remove syringe from the tray  o Check the syringe for the following   - Expiration date  - Medication is clear and colorless to slightly yellow and free from particles   - It appears unused or damaged and the gray needle cap is securely attached and the clear needle guard has not been activated (if the needle guard is covering the needle that means it has been activated)  o Choose your injection site (abdomen but not within 2 inches of the navel, thigh, or if someone else is injecting you may also use upper arms or upper outer area of buttock).  Choose a different site each time you give yourself an injection and do not inject into areas where the skin is tender, bruised, red, scaly or hard.  Avoid areas with scars or stretch marks  o Clean the injection site with an alcohol wipe using a circular motion and allow it to air dry completely (5-10 seconds)  o Hold the prefilled syringe by the syringe barrel.  Carefully pull the needle cap straight off and discard  o With your other hand, gently pinch the skin at the injection site to create a firm surface.  Insert the needle in to skin at a 45-90 degree angle. Push the needle all the way in to ensure that the medicine can be fully injected.  o Slowly press down the plunger head as far as it will go until the plunger head is completely between the needle guard wings (keep skin pinched during this)  o Keep the plunger fully pressed down while you carefully pull the needle straight out from the injection site and off your skin  o Slowly release the plunger and allow the syringe needle guard to automatically cover the exposed needle  o Dispose of the used prefilled syringe into a sharps container or hard plastic bottle.  o If there is blood at the injection site gently press a cotton ball or gauze to the site. Do not rub the injection site. Apply an adhesive bandage if needed    Adherence/Missed dose instruction: If a  dose is missed, call your doctor.    Goals of Therapy     Stimulate the growth of neutrophils (a type of white blood important to fight against infection) used after chemotherapy.    Side Effects & Monitoring Parameters   ??? Injection site irritation  ??? Pain/aching in the bones, arms and legs    The following side effects should be reported to the provider:  ??? Signs of an allergic reaction (rash, hives, shortness of breath, tongue or throat swelling)  ??? Kidney problems (unable to pass urine, blood in urine, change in amount of urine passed, change in urine color, or weight gain)  ??? Lung problems (trouble breathing, new or worsening of cough)  ??? Capillary leak syndrome (abnormal heartbeat, chest pain, shortness of breath, weight gain, vomiting blood or vomit that looks like coffee grounds, black or tarry stools)  ??? Spleen rupture (pain in left upper stomach area or left shoulder)  ??? Inflammation of the aorta (fever, abdominal pain, fatigue, back pain)    Contraindications, Warnings, & Precautions       ??? Hypersensitivity to pegfilgrastim, filgrastim, or any components of the formulation  ??? Allergy to latex  ??? Pegfilgrastim does cross the placenta therefore you should not become pregnant during treatment  ??? It is not known if pegfilgrastim passes into breastmilk therefore not recommended      Drug/Food Interactions     ??? Medication list reviewed in Epic. The patient was instructed to inform the care team before taking any new medications or supplements. No drug interactions identified.     Storage, Handling Precautions, & Disposal     ??? Ziextenzo should be stored in the refrigerator.   ??? Avoid freezing syringe but if frozen may be thawed one time. If frozen more than 1 time use a new syringe  ??? Discard if kept at room temperature for >120 hours  ??? Do not open the outer carton until you are ready to use the syringe in order to protect it from light  ??? Do not use if the syringe has been dropped on a hard surface.  It may be broken even if you cannot see the break  ??? Do not shake the prefilled syringe  ??? Keep out of the reach of children  ??? Place used devices into a sharps container for disposal (which we can supply along with band-aids and alcohol pads) or hard plastic container       Current Medications (including OTC/herbals), Comorbidities and Allergies     Current Outpatient Medications   Medication Sig Dispense Refill   ??? acetaminophen (TYLENOL) 325 MG tablet Take 2 tablets (650 mg total) by mouth every six (6) hours as needed. 30 tablet 0   ??? amLODIPine (NORVASC) 5 MG tablet Take 1 tablet (5 mg total) by mouth daily. 90 tablet 3   ??? cholecalciferol, vitamin D3, (VITAMIN D3) 5,000 unit tablet Take 5,000 Units by mouth daily.     ??? colchicine (MITIGARE) 0.6 mg cap capsule Take as needed for gout flares. Take 1.2 mg at the first sign of flare, followed in 1 hour with a single dose of 0.6. Then take 0.6 mg twice daily until flare resolves 45 capsule 3   ??? doxycycline (MONODOX) 100 MG capsule Take 1 capsule (100 mg total) by mouth Two (2) times a day. 60 capsule 5   ??? loperamide (IMODIUM) 2 mg capsule Take 2 capsules to start, then 1 capsule every 2 hours until diarrhea free for 12  hours. 60 capsule 11   ??? multivitamin (TAB-A-VITE/THERAGRAN) per tablet Take 1 tablet by mouth daily.     ??? ondansetron (ZOFRAN) 8 MG tablet Take 1 tablet (8 mg total) by mouth every eight (8) hours as needed for nausea (Do not take on days of chemotherapy administration.). 30 tablet 2   ??? pegfilgrastim-bmez (ZIEXTENZO) 6 mg/0.6 mL injection Inject the contents of 1 syringe (6 mg) under the skin once every 14 days (administer 24 hours after the completion of chemotherapy). 1.2 mL 5   ??? prochlorperazine (COMPAZINE) 10 MG tablet Take 1 tablet (10 mg total) by mouth every six (6) hours as needed (nausea). 30 tablet 2   ??? white petrolatum-mineral oiL (EUCERIN) Crea Apply topically Two (2) times a day. Apply to face, neck, chest, back, hands, and feet twice daily. 120 g 6     No current facility-administered medications for this visit.       No Known Allergies    Patient Active Problem List   Diagnosis   ??? History of gout   ??? Colon cancer (CMS-HCC)   ??? Iron deficiency anemia due to chronic blood loss   ??? Malignant neoplasm of sigmoid colon (CMS-HCC)   ??? Dehydration   ??? Adjustment disorder with depressed mood   ??? Carcinomatosis (CMS-HCC)       Reviewed and up to date in Epic.    Appropriateness of Therapy     Is medication and dose appropriate based on diagnosis? Yes    Prescription has been clinically reviewed: Yes    Baseline Quality of Life Assessment      How many days over the past month did your condition  keep you from your normal activities? For example, brushing your teeth or getting up in the morning. 0    Financial Information     Medication Assistance provided: Prior Authorization and Copay Assistance    Anticipated copay of $0 / 28 days reviewed with patient. Verified delivery address.    Delivery Information     Scheduled delivery date: 06/15/20    Expected start date: 06/16/20    Medication will be delivered via UPS to the prescription address in [facility].  This shipment will not require a signature.      Explained the services we provide at [facility] and that each month we would call to set up refills.  Stressed importance of returning phone calls so that we could ensure they receive their medications in time each month.  Informed patient that we should be setting up refills 7-10 days prior to when they will run out of medication.  A pharmacist will reach out to perform a clinical assessment periodically.  Informed patient that a welcome packet and a drug information handout will be sent.      Patient verbalized understanding of the above information as well as how to contact the pharmacy at [phone] option 4 with any questions/concerns.  The pharmacy is open Monday through Friday 8:30am-4:30pm.  A pharmacist is available 24/7 via pager to answer any clinical questions they may have.    Patient Specific Needs     - Does the patient have any physical, cognitive, or cultural barriers? No    - Patient prefers to have medications discussed with  Patient     - Is the patient or caregiver able to read and understand education materials at a high school level or above? Yes    - Patient's primary language is  [place]     -  Is the patient high risk? No    - Does the patient require a Care Management Plan? No     - Does the patient require physician intervention or other additional services (i.e. nutrition, smoking cessation, social work)? No      Mark Calhoun Vangie Bicker  [facility] Specialty Pharmacist

## 2020-06-15 ENCOUNTER — Institutional Professional Consult (permissible substitution): Admit: 2020-06-15 | Discharge: 2020-06-15 | Payer: PRIVATE HEALTH INSURANCE

## 2020-06-15 ENCOUNTER — Ambulatory Visit: Admit: 2020-06-15 | Discharge: 2020-06-15 | Payer: PRIVATE HEALTH INSURANCE

## 2020-06-15 ENCOUNTER — Ambulatory Visit
Admit: 2020-06-15 | Discharge: 2020-06-15 | Payer: PRIVATE HEALTH INSURANCE | Attending: Hematology & Oncology | Primary: Hematology & Oncology

## 2020-06-15 DIAGNOSIS — R161 Splenomegaly, not elsewhere classified: Principal | ICD-10-CM

## 2020-06-15 DIAGNOSIS — M549 Dorsalgia, unspecified: Principal | ICD-10-CM

## 2020-06-15 DIAGNOSIS — C800 Disseminated malignant neoplasm, unspecified: Principal | ICD-10-CM

## 2020-06-15 DIAGNOSIS — C187 Malignant neoplasm of sigmoid colon: Principal | ICD-10-CM

## 2020-06-15 DIAGNOSIS — Z5111 Encounter for antineoplastic chemotherapy: Principal | ICD-10-CM

## 2020-06-15 DIAGNOSIS — D61818 Other pancytopenia: Principal | ICD-10-CM

## 2020-06-15 LAB — CBC W/ AUTO DIFF
BASOPHILS ABSOLUTE COUNT: 0 10*9/L (ref 0.0–0.1)
BASOPHILS RELATIVE PERCENT: 0.4 %
EOSINOPHILS ABSOLUTE COUNT: 0.1 10*9/L (ref 0.0–0.7)
EOSINOPHILS RELATIVE PERCENT: 4.3 %
HEMATOCRIT: 37.7 % — ABNORMAL LOW (ref 38.0–50.0)
HEMOGLOBIN: 12.7 g/dL — ABNORMAL LOW (ref 13.5–17.5)
LYMPHOCYTES ABSOLUTE COUNT: 0.3 10*9/L — ABNORMAL LOW (ref 0.7–4.0)
LYMPHOCYTES RELATIVE PERCENT: 15.5 %
MEAN CORPUSCULAR HEMOGLOBIN CONC: 33.7 g/dL (ref 30.0–36.0)
MEAN CORPUSCULAR HEMOGLOBIN: 29.1 pg (ref 26.0–34.0)
MEAN CORPUSCULAR VOLUME: 86.2 fL (ref 81.0–95.0)
MEAN PLATELET VOLUME: 7.3 fL (ref 7.0–10.0)
MONOCYTES ABSOLUTE COUNT: 0.2 10*9/L (ref 0.1–1.0)
MONOCYTES RELATIVE PERCENT: 10.3 %
NEUTROPHILS ABSOLUTE COUNT: 1.4 10*9/L — ABNORMAL LOW (ref 1.7–7.7)
NEUTROPHILS RELATIVE PERCENT: 69.5 %
NUCLEATED RED BLOOD CELLS: 0 /100{WBCs} (ref <=4)
PLATELET COUNT: 58 10*9/L — ABNORMAL LOW (ref 150–450)
RED BLOOD CELL COUNT: 4.37 10*12/L (ref 4.32–5.72)
RED CELL DISTRIBUTION WIDTH: 14.9 % (ref 12.0–15.0)
WBC ADJUSTED: 2.1 10*9/L — ABNORMAL LOW (ref 3.5–10.5)

## 2020-06-15 LAB — COMPREHENSIVE METABOLIC PANEL
ALBUMIN: 3.9 g/dL (ref 3.4–5.0)
ALKALINE PHOSPHATASE: 79 U/L (ref 46–116)
ALT (SGPT): 16 U/L (ref 10–49)
ANION GAP: 6 mmol/L (ref 5–14)
AST (SGOT): 24 U/L (ref <=34)
BILIRUBIN TOTAL: 1.5 mg/dL — ABNORMAL HIGH (ref 0.3–1.2)
BLOOD UREA NITROGEN: 15 mg/dL (ref 9–23)
BUN / CREAT RATIO: 16
CALCIUM: 9.6 mg/dL (ref 8.7–10.4)
CHLORIDE: 108 mmol/L — ABNORMAL HIGH (ref 98–107)
CO2: 26.1 mmol/L (ref 20.0–31.0)
CREATININE: 0.93 mg/dL
EGFR CKD-EPI AA MALE: >90 mL/min/{1.73_m2} (ref >=60)
EGFR CKD-EPI NON-AA MALE: >90 mL/min/{1.73_m2} (ref >=60)
GLUCOSE RANDOM: 143 mg/dL (ref 70–179)
POTASSIUM: 4 mmol/L (ref 3.4–4.5)
PROTEIN TOTAL: 7.4 g/dL (ref 5.7–8.2)
SODIUM: 140 mmol/L (ref 135–145)

## 2020-06-15 LAB — MAGNESIUM: MAGNESIUM: 1.7 mg/dL (ref 1.6–2.6)

## 2020-06-15 LAB — CEA: CARCINOEMBRYONIC ANTIGEN: 28.9 ng/mL — ABNORMAL HIGH (ref 0.0–5.0)

## 2020-06-15 MED ADMIN — dexAMETHasone (DECADRON) tablet 8 mg: 8 mg | ORAL | @ 15:00:00 | Stop: 2020-06-15

## 2020-06-15 MED ADMIN — irinotecan (CAMPTOSAR) 298.6 mg in sodium chloride (NS) 0.9 % 500 mL IVPB: 150 mg/m2 | INTRAVENOUS | @ 17:00:00 | Stop: 2020-06-15

## 2020-06-15 MED ADMIN — sodium chloride (NS) 0.9 % infusion: 100 mL/h | INTRAVENOUS | @ 16:00:00 | Stop: 2020-06-15

## 2020-06-15 MED ADMIN — ondansetron (ZOFRAN) tablet 24 mg: 24 mg | ORAL | @ 15:00:00 | Stop: 2020-06-15

## 2020-06-15 MED ADMIN — romiPLOStim (NPLATE) syringe: 40 ug | SUBCUTANEOUS | @ 15:00:00 | Stop: 2020-06-15

## 2020-06-15 MED ADMIN — panitumumab (VECTIBIX) 473.4 mg in sodium chloride (NS) 0.9 % 100 mL IVPB: 6 mg/kg | INTRAVENOUS | @ 16:00:00 | Stop: 2020-06-15

## 2020-06-15 NOTE — Unmapped (Signed)
[facility]     PRIMARY CARE PROVIDER:  Carlynn Herald, MD  [address]  [place] [address]    CONSULTING PROVIDERS  Dr. Nemiah Commander, [facility] Colorectal Surgery   Dr. Reymundo Poll, [facility] Oncology  Dr. Rayetta Humphrey, [facility] Radiation Oncology  ____________________________________________________________________    CANCER HISTORY  Diagnosis 03/2018 stage III colon cancer presenting with severe anemia   1. 03/17/18 lap left/sigmoid colectomy: pT3N1b (3/42) moderately differentiated adenocarcinoma, discontig tumor nodules present, intermediate tumor budding,  IHC intact for MMR enzymes (MLH1, MSH2, MSH6, PMS2). preop CEA 4.2   2. 04/09/18-08/2018: CapeOX x 8, final cycle capecitabine only   3. 04/2019 surveillance detected local recurrence   4. 05/06/19-07/2019 FOLFIRI  5. 08/18/19 ex lap, partial proctocolectomy with unblock pelvic sidewal resection, omentectomy, appendectomy, HIPEC with mitomycin c; diverting loop ileostomy  6. 10/26/2019 ileostomy takedown  7. 12/01/2019-01/05/2020 ongoing chemoradiotherapy with capecitabine to left pelvis sidewall (planned 5000 cGy)  6. 11/2019 perc microwave ablation to seg 4 solitary met  7. 05/2020 multifocal intraperitoneal recurrence  06/15/20 irinotecan-panitumumab start [supportive care with nplate and peg-GCSF for cytopenia]      UGT1A1 *1/28     Tumor Molecular Genetics:  Tempus on primary, confirmed on repeat of recurrence;   TP53 mutation  KRAS/NRAS/BRAF WT    TREATMENT PLAN:   Irinotecan-panitumumab until progression (pending consult re: repeat CRS/HIPEC)  ____________________________________________________________________    ASSESSMENT  1. Metastatic, RAS/RAF WT, pMMR sigmoid colon cancer   2.new mild left back pain likely related to recurrent disease  3.  Multifactorial pancytopenias, largely attributable to splenomegaly  3. 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   ECOG PS 1    RECOMMENDATIONS  1. Irinotecan-pmab q14d  2. Supportive care counts: nplate 0.5 mcg/kg/week, escalate if plts get to low; peg- gCSF for baseline neutropenia  3. Supportive care: doxy for rash, antiemetics prn  4. Referral to [facility] re; role for re-do CRS/HIPEC  5. RTC here 2 weeks for next treatment; 4 weeks for visit and treatment    DISCUSSION  Again reviewed rationale for chemo, discussed above supportive care. Role of CRS/HIPEC in setting of rapid recurrence uncertain and definitely want opinion from addition experts. I lean somewhat against it, at least in the short term, given the biology of his disease at this time but on the other hand if very healthy young man so appropriate to pursue aggressive options.   ______________________________________________________________________  HISTORY     Mark Calhoun is seen today at the [facility] for ongoing management regarding colon cancer     Mark Calhoun is well. Maybe a little more tired. Also with more persistent discomfort left lower back, no inciting events. Feels better when he walks. hasnt been bad enough to need to take anything for it.   appeite fine.   Other pains remain fleeting an mild   Some bowel and bladder urgency    MEDICAL HISTORY  Interval: none  Prior:   1. Gout-  2. 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 His brother Mark Calhoun is my partner in medical oncology. Mark is married Victorino Calhoun) and has two teenage daughters (17 and 103 as of 03/2018)     PHYSICAL EXAM  There were no vitals taken for this visit.   GENERAL: well developed, well nourished, in no distress  HEENT: NCAT, pupils equal, sclerae anicteric, PSYCH: full and appropriate range of affect  with good insight and judgement      OBJECTIVE DATA  Labs reviewed in emr     RADIOLOGY RESULTS   none

## 2020-06-15 NOTE — Unmapped (Signed)
Patient arrived to infusion in stable condition for chemotherapy. Port already accessed and labs collected in earlier provider appointment. Labs reviewed, ANC 1.4, platelets 58. Notified provider and provider changed parameters, okay to treat received. Pre meds administered and chemo released to pharmacy. All infusions administered as ordered. Patient tolerated well. No adverse reactions noted. AVS declined. Port flushed, hep locked, and deaccessed per protocol. Patient discharged home to self care, in stable condition.

## 2020-06-16 NOTE — Unmapped (Signed)
Chemotherapy Regiment: Irinotecan-pmab  First Treatment Date: 12/16     Called pt to follow-up after cycle 1, pt denies any issues. Difficulty sleeping after having dex. Denies any issues with po intake. Pt denies any questions or concerns.    Reviewed upcoming appt and plan for Nplate.       Acute care visit needed? No     Do you have any questions regarding your medications?  No  Do you need any refills? No  Comments:     Are you aware of  your next appointment?  Yes   Comments:     Instructed pt to call if above symptoms unrelieved with interventions (nurse triage from 8-5 M-F, fellow-on-call after hours and on weekends). Pt voiced understanding of this plan. Approximate time spent on treatment follow-up 3 minutes.     For your safety and best care, please DO NOT use MyChart messages to report symptoms. Symptoms should be reported by calling the nurse triage line [phone] from 8:00am-5:00 pm or by calling [phone] nights after 5:00 pm, weekends, and holidays and asking for our Oncology Fellow on-call.   Please use MyChart for:   -non-urgent prescription refills   -non-urgent scheduling issues  -non-urgent general questions       Please DO NOT use MyChart for URGENT messages, as messages are only checked during weekday regular business hours. You should receive a response from our care team within 2 business days.  Please note that MyChart messages may be routed a central pool and one of your provider's team members will get back to you.

## 2020-06-22 ENCOUNTER — Ambulatory Visit: Admit: 2020-06-22 | Discharge: 2020-06-22 | Payer: PRIVATE HEALTH INSURANCE

## 2020-06-22 ENCOUNTER — Institutional Professional Consult (permissible substitution): Admit: 2020-06-22 | Discharge: 2020-06-22 | Payer: PRIVATE HEALTH INSURANCE

## 2020-06-22 DIAGNOSIS — R5383 Other fatigue: Principal | ICD-10-CM

## 2020-06-22 DIAGNOSIS — R5381 Other malaise: Principal | ICD-10-CM

## 2020-06-22 DIAGNOSIS — C187 Malignant neoplasm of sigmoid colon: Principal | ICD-10-CM

## 2020-06-22 DIAGNOSIS — D61818 Other pancytopenia: Principal | ICD-10-CM

## 2020-06-22 DIAGNOSIS — C800 Disseminated malignant neoplasm, unspecified: Principal | ICD-10-CM

## 2020-06-22 LAB — CBC W/ AUTO DIFF
BASOPHILS ABSOLUTE COUNT: 0 10*9/L (ref 0.0–0.1)
BASOPHILS RELATIVE PERCENT: 0.8 %
EOSINOPHILS ABSOLUTE COUNT: 0.1 10*9/L (ref 0.0–0.7)
EOSINOPHILS RELATIVE PERCENT: 3.1 %
HEMATOCRIT: 36.9 % — ABNORMAL LOW (ref 38.0–50.0)
HEMOGLOBIN: 12.2 g/dL — ABNORMAL LOW (ref 13.5–17.5)
LYMPHOCYTES ABSOLUTE COUNT: 0.4 10*9/L — ABNORMAL LOW (ref 0.7–4.0)
LYMPHOCYTES RELATIVE PERCENT: 18.3 %
MEAN CORPUSCULAR HEMOGLOBIN CONC: 33.1 g/dL (ref 30.0–36.0)
MEAN CORPUSCULAR HEMOGLOBIN: 28.4 pg (ref 26.0–34.0)
MEAN CORPUSCULAR VOLUME: 85.8 fL (ref 81.0–95.0)
MEAN PLATELET VOLUME: 8 fL (ref 7.0–10.0)
MONOCYTES ABSOLUTE COUNT: 0.2 10*9/L (ref 0.1–1.0)
MONOCYTES RELATIVE PERCENT: 10.7 %
NEUTROPHILS ABSOLUTE COUNT: 1.5 10*9/L — ABNORMAL LOW (ref 1.7–7.7)
NEUTROPHILS RELATIVE PERCENT: 67.1 %
NUCLEATED RED BLOOD CELLS: 0 /100{WBCs} (ref <=4)
PLATELET COUNT: 66 10*9/L — ABNORMAL LOW (ref 150–450)
RED BLOOD CELL COUNT: 4.3 10*12/L — ABNORMAL LOW (ref 4.32–5.72)
RED CELL DISTRIBUTION WIDTH: 14.3 % (ref 12.0–15.0)
WBC ADJUSTED: 2.2 10*9/L — ABNORMAL LOW (ref 3.5–10.5)

## 2020-06-22 MED ADMIN — romiPLOStim (NPLATE) syringe: 40 ug | SUBCUTANEOUS | @ 16:00:00 | Stop: 2020-06-22

## 2020-06-22 NOTE — Unmapped (Signed)
Labs drawn via [place].    Port flushed and brisk blood return     Pt tolerated well.    Results reviewed - nplate ordered.    Interventions - intervention needed.    Pt stable and ambulated independently from clinic.

## 2020-06-22 NOTE — Unmapped (Signed)
Labs completed via [place].     Pt received Nplate injection in left deltoid - pt tolerated well.    AVS refused.    Pt stable and ambulated independently from clinic.

## 2020-06-23 NOTE — Unmapped (Signed)
[facility] at [facility]       Type of visit:  face to face    Reason for visit: follow up    Questions / Concerns that need to be addressed: paperwork to be filled out    General Consent to Treat (GCT) for non-epic video visits only: Verbal consent      Diabetes:  ??? Regularly checking blood sugars?: no  o If yes, when? Complete log for past 7 days  Date Before Breakfast After Breakfast Before Lunch After Lunch Before Dinner After Dinner Before Bed                                                                                                                                     Hypertension:  ??? Have blood pressure cuff at home?: yes- arm cuff  ??? Regularly checking blood pressure?: sometimes  If yes, complete log for past 7 days  Date Time BP Pulse                                                                             Screening BP- 128/83        Allergies reviewed: Yes    Medication reviewed: Yes  Pended refills? No        HCDM reviewed and updated in Epic:    We are working to make sure all of our patients??? wishes are updated in Epic and part of that is documenting a Environmental health practitioner for each patient  A Health Care Decision Rodena Piety is someone you choose who can make health care decisions for you if you are not able ??? who would you most want to do this for you????  is already up to date.        BPAs completed:  AUDIT - Alcohol Screen  HARK - Interpersonal Violence      COVID-19 Vaccine Summary  Which COVID-19 Vaccine was administered  Pfizer  Type:  Complete  Dates Given:  04/20/2020                       Immunization History   Administered Date(s) Administered   ??? COVID-19 VACC,MRNA,(PFIZER)(PF)(IM) 03/28/2020, 04/20/2020   ??? Influenza Vaccine Quad (IIV4 PF) 10mo+ injectable 05/06/2019, 04/05/2020   ??? Influenza Virus Vaccine, unspecified formulation 05/07/2013, 05/24/2014, 03/16/2018   ??? PPD Test 02/07/2015   ??? TdaP 04/06/2020 __________________________________________________________________________________________    SCREENINGS COMPLETED IN FLOWSHEETS    HARK Screening       AUDIT  AUDIT - C Score (Part 1): 0    PHQ2  PHQ9          P4 Suicidality Screener                GAD7       COPD Assessment       Falls Risk       .imcres

## 2020-06-29 ENCOUNTER — Ambulatory Visit: Admit: 2020-06-29 | Discharge: 2020-06-30 | Payer: PRIVATE HEALTH INSURANCE

## 2020-06-29 DIAGNOSIS — C800 Disseminated malignant neoplasm, unspecified: Principal | ICD-10-CM

## 2020-06-29 DIAGNOSIS — Z79899 Other long term (current) drug therapy: Principal | ICD-10-CM

## 2020-06-29 DIAGNOSIS — Z5111 Encounter for antineoplastic chemotherapy: Principal | ICD-10-CM

## 2020-06-29 DIAGNOSIS — C187 Malignant neoplasm of sigmoid colon: Principal | ICD-10-CM

## 2020-06-29 LAB — CBC W/ AUTO DIFF
BASOPHILS ABSOLUTE COUNT: 0 10*9/L (ref 0.0–0.1)
BASOPHILS RELATIVE PERCENT: 0.4 %
EOSINOPHILS ABSOLUTE COUNT: 0.1 10*9/L (ref 0.0–0.7)
EOSINOPHILS RELATIVE PERCENT: 3.3 %
HEMATOCRIT: 31.9 % — ABNORMAL LOW (ref 38.0–50.0)
HEMOGLOBIN: 11 g/dL — ABNORMAL LOW (ref 13.5–17.5)
LYMPHOCYTES ABSOLUTE COUNT: 0.4 10*9/L — ABNORMAL LOW (ref 0.7–4.0)
LYMPHOCYTES RELATIVE PERCENT: 17 %
MEAN CORPUSCULAR HEMOGLOBIN CONC: 34.6 g/dL (ref 30.0–36.0)
MEAN CORPUSCULAR HEMOGLOBIN: 29.2 pg (ref 26.0–34.0)
MEAN CORPUSCULAR VOLUME: 84.3 fL (ref 81.0–95.0)
MEAN PLATELET VOLUME: 6.8 fL — ABNORMAL LOW (ref 7.0–10.0)
MONOCYTES ABSOLUTE COUNT: 0.3 10*9/L (ref 0.1–1.0)
MONOCYTES RELATIVE PERCENT: 10.5 %
NEUTROPHILS ABSOLUTE COUNT: 1.7 10*9/L (ref 1.7–7.7)
NEUTROPHILS RELATIVE PERCENT: 68.8 %
NUCLEATED RED BLOOD CELLS: 0 /100{WBCs} (ref <=4)
PLATELET COUNT: 121 10*9/L — ABNORMAL LOW (ref 150–450)
RED BLOOD CELL COUNT: 3.78 10*12/L — ABNORMAL LOW (ref 4.32–5.72)
RED CELL DISTRIBUTION WIDTH: 14.6 % (ref 12.0–15.0)
WBC ADJUSTED: 2.5 10*9/L — ABNORMAL LOW (ref 3.5–10.5)

## 2020-06-29 LAB — BILIRUBIN, TOTAL: BILIRUBIN TOTAL: 0.9 mg/dL (ref 0.3–1.2)

## 2020-06-29 LAB — MAGNESIUM: MAGNESIUM: 1.9 mg/dL (ref 1.6–2.6)

## 2020-06-29 LAB — POTASSIUM: POTASSIUM: 3.9 mmol/L (ref 3.4–4.5)

## 2020-06-29 MED ADMIN — heparin, porcine (PF) 100 unit/mL injection 500 Units: 500 [IU] | INTRAVENOUS | @ 22:00:00 | Stop: 2020-06-29

## 2020-06-29 MED ADMIN — panitumumab (VECTIBIX) 473.4 mg in sodium chloride (NS) 0.9 % 100 mL IVPB: 6 mg/kg | INTRAVENOUS | @ 19:00:00 | Stop: 2020-06-29

## 2020-06-29 MED ADMIN — ondansetron (ZOFRAN) tablet 24 mg: 24 mg | ORAL | @ 19:00:00 | Stop: 2020-06-29

## 2020-06-29 MED ADMIN — romiPLOStim (NPLATE) syringe: 40 ug | SUBCUTANEOUS | @ 21:00:00 | Stop: 2020-06-29

## 2020-06-29 MED ADMIN — dexAMETHasone (DECADRON) tablet 8 mg: 8 mg | ORAL | @ 19:00:00 | Stop: 2020-06-29

## 2020-06-29 MED ADMIN — irinotecan (CAMPTOSAR) 298.6 mg in sodium chloride (NS) 0.9 % 500 mL IVPB: 150 mg/m2 | INTRAVENOUS | @ 20:00:00 | Stop: 2020-06-29

## 2020-06-29 NOTE — Unmapped (Signed)
Patient arrived in the infusion clinic at 1420.  Vitals were obtained. Port was accessed, flushed with blood return, dressing clean, dry, and intact.  Labs were drawn and resulted within treatment plan parameters.  Patient was administered premedications at 1344.  Patient was chemotherapy regiment as ordered without complications while in the clinic.  Port was flushed with blood return, heparin locked, then de-accessed area covered with 2x2 gauze and ban-aid.  Patient declined printed after visit summary then discharged home to self care.  Marland Kitchen

## 2020-07-04 NOTE — Unmapped (Signed)
[facility] Clinical Assessment & Refill Coordination Note    Mark Calhoun, DOB: 09-Jun-1966  Phone: [phone] (home)     All above HIPAA information was verified with patient.     Was a Nurse, learning disability used for this call? No    Specialty Medication(s):   Hematology/Oncology: Ziextenzo     Current Outpatient Medications   Medication Sig Dispense Refill   ??? acetaminophen (TYLENOL) 325 MG tablet Take 2 tablets (650 mg total) by mouth every six (6) hours as needed. 30 tablet 0   ??? amLODIPine (NORVASC) 5 MG tablet Take 1 tablet (5 mg total) by mouth daily. 90 tablet 3   ??? cholecalciferol, vitamin D3, (VITAMIN D3) 5,000 unit tablet Take 5,000 Units by mouth daily.     ??? colchicine (MITIGARE) 0.6 mg cap capsule Take as needed for gout flares. Take 1.2 mg at the first sign of flare, followed in 1 hour with a single dose of 0.6. Then take 0.6 mg twice daily until flare resolves 45 capsule 3   ??? doxycycline (MONODOX) 100 MG capsule Take 1 capsule (100 mg total) by mouth Two (2) times a day. 60 capsule 5   ??? loperamide (IMODIUM) 2 mg capsule Take 2 capsules to start, then 1 capsule every 2 hours until diarrhea free for 12 hours. 60 capsule 11   ??? multivitamin (TAB-A-VITE/THERAGRAN) per tablet Take 1 tablet by mouth daily.     ??? ondansetron (ZOFRAN) 8 MG tablet Take 1 tablet (8 mg total) by mouth every eight (8) hours as needed for nausea (Do not take on days of chemotherapy administration.). 30 tablet 2   ??? pegfilgrastim-bmez (ZIEXTENZO) 6 mg/0.6 mL injection Inject the contents of 1 syringe (6 mg) under the skin once every 14 days (administer 24 hours after the completion of chemotherapy). 1.2 mL 5   ??? prochlorperazine (COMPAZINE) 10 MG tablet Take 1 tablet (10 mg total) by mouth every six (6) hours as needed (nausea). 30 tablet 2   ??? white petrolatum-mineral oiL (EUCERIN) Crea Apply topically Two (2) times a day. Apply to face, neck, chest, back, hands, and feet twice daily. 120 g 6     No current facility-administered medications for this visit.        Changes to medications: Mark Calhoun reports no changes at this time.    No Known Allergies    Changes to allergies: No    SPECIALTY MEDICATION ADHERENCE     Ziextenzo 6/0.6 mg/ml: 10 days of medicine on hand       Medication Adherence    Patient reported X missed doses in the last month: 0  Specialty Medication: Ziextenzo  Patient is on additional specialty medications: No  Informant: patient  Reasons for non-adherence: no problems identified  Confirmed plan for next specialty medication refill: delivery by pharmacy  Refills needed for supportive medications: not needed          Specialty medication(s) dose(s) confirmed: Regimen is correct and unchanged.     Are there any concerns with adherence? No    Adherence counseling provided? Not needed    CLINICAL MANAGEMENT AND INTERVENTION      Clinical Benefit Assessment:    Do you feel the medicine is effective or helping your condition? Yes    Clinical Benefit counseling provided? Not needed    Adverse Effects Assessment:    Are you experiencing any side effects? No    Are you experiencing difficulty administering your medicine? No  Quality of Life Assessment:    How many days over the past month did your condition  keep you from your normal activities? For example, brushing your teeth or getting up in the morning. 0    Have you discussed this with your provider? Not needed    Therapy Appropriateness:    Is therapy appropriate? Yes, therapy is appropriate and should be continued    DISEASE/MEDICATION-SPECIFIC INFORMATION      For patients on injectable medications: Patient currently has 0 doses left.  Next injection is scheduled for 07/14/20.    PATIENT SPECIFIC NEEDS     - Does the patient have any physical, cognitive, or cultural barriers? No    - Is the patient high risk? No    - Does the patient require a Care Management Plan? No     - Does the patient require physician intervention or other additional services (i.e. nutrition, smoking cessation, social work)? No      SHIPPING     Specialty Medication(s) to be Shipped:   Hematology/Oncology: Ziextenzo    Other medication(s) to be shipped: No additional medications requested for fill at this time     Changes to insurance: No    Delivery Scheduled: Yes, Expected medication delivery date: 07/12/20.     Medication will be delivered via UPS to the confirmed prescription address in [facility].    The patient will receive a drug information handout for each medication shipped and additional FDA Medication Guides as required.  Verified that patient has previously received a Conservation officer, historic buildings.    All of the patient's questions and concerns have been addressed.    Mark Calhoun   [facility] Specialty Pharmacist

## 2020-07-05 NOTE — Unmapped (Signed)
Internal Medicine Clinic    Return Visit    Face to Face    Assessment and Plan:    1. Malignant neoplasm of sigmoid colon (CMS-HCC)    2. Pancytopenia (CMS-HCC)    3. Malaise and fatigue      I completed his paperwork for work  Has functional limitations from his disease  Significant colon malignancy with active chemotherapy and pancytopenia  Patient should not return to at this time  Return to pcp for further    I spent a total of 25 min with the patient including pre intra and post visit activities    Medication adherence and barriers to the treatment plan have been addressed. Opportunities to optimize healthy behaviors have been discussed. Patient / caregiver voiced understanding.      _____________________________________________________________________    Chief Complaint:  Medical forms    Patient Active Problem List    Diagnosis Date Noted   ??? Carcinomatosis (CMS-HCC) 06/01/2020   ??? Adjustment disorder with depressed mood 09/30/2019   ??? Dehydration 09/15/2019   ??? Malignant neoplasm of sigmoid colon (CMS-HCC) 05/06/2019   ??? Iron deficiency anemia due to chronic blood loss 03/31/2018   ??? Colon cancer (CMS-HCC) 03/10/2018   ??? History of gout 02/23/2018       HPI:  Mr. Blanford is a pleasant 55 yo male with stage 3 colon cancer here for completion of paperwork for work.  He is currently undergoing chemotherapy.  Started 12/16 and has been quite taxing.  Can't stand very long and energy is low.  Mood ok, a little depressed for sure but feels he is getting by.  Needs paperwork completed for work so that he can remain out and complete his treatments and focus on getting better.    Current Outpatient Medications on File Prior to Visit   Medication Sig   ??? acetaminophen (TYLENOL) 325 MG tablet Take 2 tablets (650 mg total) by mouth every six (6) hours as needed.   ??? amLODIPine (NORVASC) 5 MG tablet Take 1 tablet (5 mg total) by mouth daily.   ??? cholecalciferol, vitamin D3, (VITAMIN D3) 5,000 unit tablet Take 5,000 Units by mouth daily.   ??? colchicine (MITIGARE) 0.6 mg cap capsule Take as needed for gout flares. Take 1.2 mg at the first sign of flare, followed in 1 hour with a single dose of 0.6. Then take 0.6 mg twice daily until flare resolves   ??? doxycycline (MONODOX) 100 MG capsule Take 1 capsule (100 mg total) by mouth Two (2) times a day.   ??? loperamide (IMODIUM) 2 mg capsule Take 2 capsules to start, then 1 capsule every 2 hours until diarrhea free for 12 hours.   ??? multivitamin (TAB-A-VITE/THERAGRAN) per tablet Take 1 tablet by mouth daily.   ??? ondansetron (ZOFRAN) 8 MG tablet Take 1 tablet (8 mg total) by mouth every eight (8) hours as needed for nausea (Do not take on days of chemotherapy administration.).   ??? pegfilgrastim-bmez (ZIEXTENZO) 6 mg/0.6 mL injection Inject the contents of 1 syringe (6 mg) under the skin once every 14 days (administer 24 hours after the completion of chemotherapy).   ??? prochlorperazine (COMPAZINE) 10 MG tablet Take 1 tablet (10 mg total) by mouth every six (6) hours as needed (nausea).   ??? white petrolatum-mineral oiL (EUCERIN) Crea Apply topically Two (2) times a day. Apply to face, neck, chest, back, hands, and feet twice daily.     No current facility-administered medications on file prior to visit.  Review of Systems - fatigue, low energy, anemia pancytopenia    Exam:  BP 128/83  - Pulse 91  - Temp 36.1 ??C (97 ??F) (Temporal)  - Ht 180.3 cm (5' 11)  - Wt 75.8 kg (167 lb)  - SpO2 99%  - BMI 23.29 kg/m??   Gen: chronically ill appearing in NAD  Speech intact  No focal deficits    Laural Benes, PA-C  Supervising physician Gabrielle Dare, MD

## 2020-07-07 ENCOUNTER — Institutional Professional Consult (permissible substitution): Admit: 2020-07-07 | Discharge: 2020-07-07 | Payer: PRIVATE HEALTH INSURANCE

## 2020-07-07 ENCOUNTER — Ambulatory Visit: Admit: 2020-07-07 | Discharge: 2020-07-07 | Payer: PRIVATE HEALTH INSURANCE

## 2020-07-07 ENCOUNTER — Ambulatory Visit
Admit: 2020-07-07 | Discharge: 2020-07-07 | Payer: PRIVATE HEALTH INSURANCE | Attending: Student in an Organized Health Care Education/Training Program | Primary: Student in an Organized Health Care Education/Training Program

## 2020-07-07 DIAGNOSIS — C187 Malignant neoplasm of sigmoid colon: Principal | ICD-10-CM

## 2020-07-07 DIAGNOSIS — Z79899 Other long term (current) drug therapy: Principal | ICD-10-CM

## 2020-07-07 DIAGNOSIS — R197 Diarrhea, unspecified: Principal | ICD-10-CM

## 2020-07-07 DIAGNOSIS — I10 Essential (primary) hypertension: Principal | ICD-10-CM

## 2020-07-07 DIAGNOSIS — M109 Gout, unspecified: Principal | ICD-10-CM

## 2020-07-07 DIAGNOSIS — C800 Disseminated malignant neoplasm, unspecified: Principal | ICD-10-CM

## 2020-07-07 LAB — CBC W/ AUTO DIFF
BASOPHILS ABSOLUTE COUNT: 0 10*9/L (ref 0.0–0.1)
BASOPHILS RELATIVE PERCENT: 0.4 %
EOSINOPHILS ABSOLUTE COUNT: 0.1 10*9/L (ref 0.0–0.7)
EOSINOPHILS RELATIVE PERCENT: 1.6 %
HEMATOCRIT: 38.1 % (ref 38.0–50.0)
HEMOGLOBIN: 12.9 g/dL — ABNORMAL LOW (ref 13.5–17.5)
LYMPHOCYTES ABSOLUTE COUNT: 0.8 10*9/L (ref 0.7–4.0)
LYMPHOCYTES RELATIVE PERCENT: 19.6 %
MEAN CORPUSCULAR HEMOGLOBIN CONC: 34 g/dL (ref 30.0–36.0)
MEAN CORPUSCULAR HEMOGLOBIN: 29 pg (ref 26.0–34.0)
MEAN CORPUSCULAR VOLUME: 85.5 fL (ref 81.0–95.0)
MEAN PLATELET VOLUME: 7.5 fL (ref 7.0–10.0)
MONOCYTES ABSOLUTE COUNT: 0.5 10*9/L (ref 0.1–1.0)
MONOCYTES RELATIVE PERCENT: 11.3 %
NEUTROPHILS ABSOLUTE COUNT: 2.7 10*9/L (ref 1.7–7.7)
NEUTROPHILS RELATIVE PERCENT: 67.1 %
NUCLEATED RED BLOOD CELLS: 0 /100{WBCs} (ref <=4)
PLATELET COUNT: 179 10*9/L (ref 150–450)
RED BLOOD CELL COUNT: 4.45 10*12/L (ref 4.32–5.72)
RED CELL DISTRIBUTION WIDTH: 14.4 % (ref 12.0–15.0)
WBC ADJUSTED: 4 10*9/L (ref 3.5–10.5)

## 2020-07-07 MED ADMIN — romiPLOStim (NPLATE) syringe: 40 ug | SUBCUTANEOUS | @ 18:00:00 | Stop: 2020-07-07

## 2020-07-07 NOTE — Unmapped (Signed)
[facility]       Type of visit:  face to face    Reason for visit: F/U HTN    Questions / Concerns that need to be addressed:     General Consent to Treat (GCT) for non-epic video visits only:       Diabetes:  ??? Regularly checking blood sugars?: no  o If yes, when? Complete log for past 7 days  Date Before Breakfast After Breakfast Before Lunch After Lunch Before Dinner After Dinner Before Bed                                                                                                                                     Hypertension:  ??? Have blood pressure cuff at home?: yes- arm cuff  ??? Regularly checking blood pressure?: yes OCCASIONALLY  If yes, complete log for past 7 days  Date Time BP Pulse                                                                             Screening BP-         [employer] BPs (complete if screening BP has a systolic  > 139 or diastolic > 89)  BP#1 122/84,P-93   BP#2 124/85,P-91  BP#3 120/87,P-8    Average BP 122/85,P-91  (please note this as a comment in vitals)         Allergies reviewed: Yes    Medication reviewed: Yes  Pended refills? No        HCDM reviewed and updated in Epic:    We are working to make sure all of our patients??? wishes are updated in Epic and part of that is documenting a Environmental health practitioner for each patient  A Health Care Decision Rodena Piety is someone you choose who can make health care decisions for you if you are not able ??? who would you most want to do this for you????  is already up to date.        BPAs completed:  Falls Risk - adults 65+      COVID-19 Vaccine Summary  Which COVID-19 Vaccine was administered  Pfizer  Type:  Complete  Dates Given:  04/20/2020                   If no: Are you interested in scheduling?     Immunization History   Administered Date(s) Administered   ??? COVID-19 VACC,MRNA,(PFIZER)(PF)(IM) 03/28/2020, 04/20/2020   ??? Influenza Vaccine Quad (IIV4 PF) 8mo+ injectable 05/06/2019, 04/05/2020   ??? Influenza Virus Vaccine, unspecified formulation 05/07/2013, 05/24/2014, 03/16/2018   ???  PPD Test 02/07/2015   ??? TdaP 04/06/2020       __________________________________________________________________________________________    SCREENINGS COMPLETED IN FLOWSHEETS    HARK Screening       AUDIT       PHQ2       PHQ9          P4 Suicidality Screener                GAD7       COPD Assessment       Falls Risk  Falls Risk  Have you fallen in the past year?: No  Do you feel unsteady when standing or walking?: No    .imcres

## 2020-07-07 NOTE — Unmapped (Signed)
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    1. Diarrhea, unspecified type    2. Malignant neoplasm of sigmoid colon (CMS-HCC)    3. Essential hypertension    4. Gout, unspecified cause, unspecified chronicity, unspecified site      Diarrhea  Favor diarrhea as a complication from chemotherapy given personal hx of diarrhea with irinotecan, but differential includes C dif given broad spectrum abxs and infectious diarrhea.  - Enteric pathogen panel  - C dif testing    Metastatic Colon Adenocarcinoma  Is seeing Dr Forbes Cellar next week. Pt does want to prolong his life to see his daughter graduate from high school, and he wants to remain full code for now.  - Spouse will continue on as his HCDM    HTN  Stable.  - Continue with amlodipine 5    Gout  No new complaints today.  - Has colchicine available as needed    Health Maintenance  - Pt needs his covid booster, but did not have time to receive today. Will come see me again next week and we will administer his booster then    Return in about 1 week (around 07/14/2020).    Staffed with Dr. 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 His main complaint today is that after having an infusion of irinotecan-panitumumab on 06/15/20 with prophylactic doxycycline started on that day, he developed profuse, loose, non-bloody diarrhea with 5-6 stools a day. It started 2 days after his chemotherapy and he discontinued his doxycycline 2 days after that. No skin issues with the panitumumab. Has not had any fevers or abdominal pain associated with the diarrhea. Endorses some mild nausea but thinks he has been able to maintain his fluid intake enough to compensate for the diarrhea. Says that in the past when he had irinotecan he had some mild diarrhea but nothing this severe.    His wife recently suffered a stroke and was completely paralyzed from this, but she is regaining her strength. His mood is good. He wants to live long enough to see his daughter, who is a Holiday representative in Producer, television/film/video, graduate from [employer]. He is currently considering undergoing a second round of HiPEC depending on what his next set of scans show.     Dr Selena Batten thinks that his wife has decision making capacity, and if he becomes unable to make his own healthcare decisions then he wants her to make his decisions. If she is unable to then he wants his brother, a successful Investment banker, operational at [employer], to make them. He wants his code status to stay full code for now, but if he does not end up undergoing HiPEC then he will strongly consider changing to DNR. He realizes that code status can be changed before and after surgeries in making this decision.  __________________________________________________________    Problem  List:  Patient Active Problem List   Diagnosis   ??? History of gout   ??? Colon cancer (CMS-HCC)   ??? Iron deficiency anemia due to chronic blood loss   ??? Malignant neoplasm of sigmoid colon (CMS-HCC)   ??? Dehydration   ??? Adjustment disorder with depressed mood   ??? Carcinomatosis (CMS-HCC)       Medications:  Reviewed in EPIC  __________________________________________________________    Physical Exam:   Vital Signs:  Vitals:    07/07/20 1027   BP: 122/85   Pulse: 91   Temp: 36.7 ??C   TempSrc: Oral   SpO2: 98%   Weight: 73.1 kg (161 lb 3.2 oz)   Height: 180.3 cm (5' 11)       Gen: Well appearing, NAD  HEENT: Moist mucous membranes, no scleral icterus  CV: RRR, no murmurs  Pulm: CTA bilaterally, no crackles or wheezes  Abd: Soft, NTND, normal BS. No HSM.  Ext: No edema, wwp  Neuro: A/ox3, CN II-XII intact    Medication adherence and barriers to the treatment plan have been addressed. Opportunities to optimize healthy behaviors have been discussed. Patient / caregiver voiced understanding.

## 2020-07-07 NOTE — Unmapped (Signed)
[name] presents to [facility] clinic today for labs and Nplate injection. CBC completed and Nplate given as directed per therapy plan. Pt tolerated injection well. Mark Calhoun has no further needs or concerns at this time and has future appts scheduled.

## 2020-07-09 NOTE — Unmapped (Signed)
I reviewed with the resident the medical history and the resident???s findings on physical examination.  I discussed with the resident the patient???s diagnosis and concur with the treatment plan as documented in the resident note. Thea Alken, MD

## 2020-07-11 DIAGNOSIS — Z5111 Encounter for antineoplastic chemotherapy: Principal | ICD-10-CM

## 2020-07-11 DIAGNOSIS — C187 Malignant neoplasm of sigmoid colon: Principal | ICD-10-CM

## 2020-07-11 MED ORDER — PEGFILGRASTIM-BMEZ 6 MG/0.6 ML SUBCUTANEOUS SYRINGE
5 refills | 0 days | Status: CP
Start: 2020-07-11 — End: ?

## 2020-07-11 NOTE — Unmapped (Signed)
The [facility] has received the prescription(s) for Ziextenzo 6mg /0.50ml syringe. The triage team has completed the benefits investigation and has determined that the patient is NOT able to fill this medication at the [facility] due to insurance plan limitations. Please see additional information below and re-route the prescription to the preferred pharmacy. Thank you.    PA Required: No    Specialty Pharmacy Required:  [facility] - Phone: [phone]

## 2020-07-11 NOTE — Unmapped (Signed)
Mark Calhoun 's Ziextenzo shipment will be canceled  as a result of no longer being eligible to fill at [facility] pharmacy.     I have reached out to the patient and communicated the delivery change. We will not reschedule the medication and have removed this/these medication(s) from the work request.  We have canceled this work request.

## 2020-07-11 NOTE — Unmapped (Signed)
Rerouted Ziextenzo to [facility] per requirement by [employer] following notification from [facility]. Instructions attached to prescription for [facility] for first delivery to occur to patient by 07/14/20. MAP team notified given need for coordination with financial assistance obtained previously.    Care coordination: 10 minutes

## 2020-07-12 DIAGNOSIS — C800 Disseminated malignant neoplasm, unspecified: Principal | ICD-10-CM

## 2020-07-12 DIAGNOSIS — C187 Malignant neoplasm of sigmoid colon: Principal | ICD-10-CM

## 2020-07-12 NOTE — Unmapped (Signed)
This patient has been disenrolled from the [facility] specialty pharmacy services due to a pharmacy change resulting from insurance limitations. The insurance company requires the patient fill at [facility].    Kermit Balo  [facility] Specialty Pharmacist

## 2020-07-13 ENCOUNTER — Institutional Professional Consult (permissible substitution): Admit: 2020-07-13 | Discharge: 2020-07-13 | Payer: PRIVATE HEALTH INSURANCE

## 2020-07-13 ENCOUNTER — Ambulatory Visit: Admit: 2020-07-13 | Discharge: 2020-07-13 | Payer: PRIVATE HEALTH INSURANCE

## 2020-07-13 ENCOUNTER — Ambulatory Visit
Admit: 2020-07-13 | Discharge: 2020-07-13 | Payer: PRIVATE HEALTH INSURANCE | Attending: Hematology & Oncology | Primary: Hematology & Oncology

## 2020-07-13 DIAGNOSIS — C800 Disseminated malignant neoplasm, unspecified: Principal | ICD-10-CM

## 2020-07-13 DIAGNOSIS — C187 Malignant neoplasm of sigmoid colon: Principal | ICD-10-CM

## 2020-07-13 DIAGNOSIS — R161 Splenomegaly, not elsewhere classified: Principal | ICD-10-CM

## 2020-07-13 DIAGNOSIS — M549 Dorsalgia, unspecified: Principal | ICD-10-CM

## 2020-07-13 DIAGNOSIS — Z5111 Encounter for antineoplastic chemotherapy: Principal | ICD-10-CM

## 2020-07-13 MED ORDER — ERYTHROMYCIN 5 MG/GRAM (0.5 %) EYE OINTMENT
Freq: Every evening | OPHTHALMIC | 1 refills | 0 days | Status: CP
Start: 2020-07-13 — End: ?

## 2020-07-20 ENCOUNTER — Ambulatory Visit: Admit: 2020-07-20 | Discharge: 2020-07-20 | Payer: PRIVATE HEALTH INSURANCE

## 2020-07-20 ENCOUNTER — Institutional Professional Consult (permissible substitution): Admit: 2020-07-20 | Discharge: 2020-07-20 | Payer: PRIVATE HEALTH INSURANCE

## 2020-07-20 ENCOUNTER — Encounter: Admit: 2020-07-20 | Discharge: 2020-07-20 | Payer: PRIVATE HEALTH INSURANCE

## 2020-07-20 DIAGNOSIS — C800 Disseminated malignant neoplasm, unspecified: Principal | ICD-10-CM

## 2020-07-20 DIAGNOSIS — C187 Malignant neoplasm of sigmoid colon: Principal | ICD-10-CM

## 2020-07-27 ENCOUNTER — Ambulatory Visit: Admit: 2020-07-27 | Discharge: 2020-07-28 | Payer: PRIVATE HEALTH INSURANCE

## 2020-07-27 DIAGNOSIS — Z5112 Encounter for antineoplastic immunotherapy: Principal | ICD-10-CM

## 2020-07-27 DIAGNOSIS — C187 Malignant neoplasm of sigmoid colon: Principal | ICD-10-CM

## 2020-07-27 DIAGNOSIS — C800 Disseminated malignant neoplasm, unspecified: Principal | ICD-10-CM

## 2020-07-27 DIAGNOSIS — Z5111 Encounter for antineoplastic chemotherapy: Principal | ICD-10-CM

## 2020-08-03 ENCOUNTER — Institutional Professional Consult (permissible substitution): Admit: 2020-08-03 | Discharge: 2020-08-03 | Payer: PRIVATE HEALTH INSURANCE

## 2020-08-03 ENCOUNTER — Ambulatory Visit: Admit: 2020-08-03 | Discharge: 2020-08-03 | Payer: PRIVATE HEALTH INSURANCE

## 2020-08-03 DIAGNOSIS — C800 Disseminated malignant neoplasm, unspecified: Principal | ICD-10-CM

## 2020-08-03 DIAGNOSIS — C187 Malignant neoplasm of sigmoid colon: Principal | ICD-10-CM

## 2020-08-07 DIAGNOSIS — C800 Disseminated malignant neoplasm, unspecified: Principal | ICD-10-CM

## 2020-08-07 DIAGNOSIS — C187 Malignant neoplasm of sigmoid colon: Principal | ICD-10-CM

## 2020-08-10 ENCOUNTER — Institutional Professional Consult (permissible substitution): Admit: 2020-08-10 | Discharge: 2020-08-10 | Payer: PRIVATE HEALTH INSURANCE

## 2020-08-10 ENCOUNTER — Ambulatory Visit
Admit: 2020-08-10 | Discharge: 2020-08-10 | Payer: PRIVATE HEALTH INSURANCE | Attending: Hematology & Oncology | Primary: Hematology & Oncology

## 2020-08-10 ENCOUNTER — Ambulatory Visit: Admit: 2020-08-10 | Discharge: 2020-08-10 | Payer: PRIVATE HEALTH INSURANCE

## 2020-08-10 DIAGNOSIS — C187 Malignant neoplasm of sigmoid colon: Principal | ICD-10-CM

## 2020-08-10 DIAGNOSIS — R161 Splenomegaly, not elsewhere classified: Principal | ICD-10-CM

## 2020-08-10 DIAGNOSIS — Z79899 Other long term (current) drug therapy: Principal | ICD-10-CM

## 2020-08-10 DIAGNOSIS — D61818 Other pancytopenia: Principal | ICD-10-CM

## 2020-08-10 DIAGNOSIS — C800 Disseminated malignant neoplasm, unspecified: Principal | ICD-10-CM

## 2020-08-10 DIAGNOSIS — K521 Toxic gastroenteritis and colitis: Principal | ICD-10-CM

## 2020-08-10 DIAGNOSIS — Z5111 Encounter for antineoplastic chemotherapy: Principal | ICD-10-CM

## 2020-08-10 DIAGNOSIS — T451X5A Adverse effect of antineoplastic and immunosuppressive drugs, initial encounter: Principal | ICD-10-CM

## 2020-08-20 DIAGNOSIS — C187 Malignant neoplasm of sigmoid colon: Principal | ICD-10-CM

## 2020-08-22 ENCOUNTER — Ambulatory Visit: Admit: 2020-08-22 | Discharge: 2020-08-23 | Payer: PRIVATE HEALTH INSURANCE

## 2020-08-23 DIAGNOSIS — C187 Malignant neoplasm of sigmoid colon: Principal | ICD-10-CM

## 2020-08-23 DIAGNOSIS — C800 Disseminated malignant neoplasm, unspecified: Principal | ICD-10-CM

## 2020-08-23 MED ORDER — LOPERAMIDE 2 MG CAPSULE
ORAL_CAPSULE | 11 refills | 0 days | Status: CP
Start: 2020-08-23 — End: ?

## 2020-08-24 ENCOUNTER — Ambulatory Visit: Admit: 2020-08-24 | Discharge: 2020-08-24 | Payer: PRIVATE HEALTH INSURANCE

## 2020-08-24 ENCOUNTER — Ambulatory Visit
Admit: 2020-08-24 | Discharge: 2020-08-24 | Payer: PRIVATE HEALTH INSURANCE | Attending: Hematology & Oncology | Primary: Hematology & Oncology

## 2020-08-24 ENCOUNTER — Institutional Professional Consult (permissible substitution): Admit: 2020-08-24 | Discharge: 2020-08-24 | Payer: PRIVATE HEALTH INSURANCE

## 2020-08-24 DIAGNOSIS — C800 Disseminated malignant neoplasm, unspecified: Principal | ICD-10-CM

## 2020-08-24 DIAGNOSIS — C187 Malignant neoplasm of sigmoid colon: Principal | ICD-10-CM

## 2020-08-24 MED ORDER — SLOW-MAG 71.5 MG TABLET,DELAYED RELEASE
ORAL_TABLET | Freq: Two times a day (BID) | ORAL | 0 refills | 30.00000 days | Status: CP
Start: 2020-08-24 — End: ?

## 2020-09-07 ENCOUNTER — Ambulatory Visit: Admit: 2020-09-07 | Discharge: 2020-09-08 | Payer: PRIVATE HEALTH INSURANCE

## 2020-09-07 ENCOUNTER — Other Ambulatory Visit: Admit: 2020-09-07 | Discharge: 2020-09-08 | Payer: PRIVATE HEALTH INSURANCE

## 2020-09-07 DIAGNOSIS — C187 Malignant neoplasm of sigmoid colon: Principal | ICD-10-CM

## 2020-09-07 DIAGNOSIS — C800 Disseminated malignant neoplasm, unspecified: Principal | ICD-10-CM

## 2020-09-13 DIAGNOSIS — C189 Malignant neoplasm of colon, unspecified: Principal | ICD-10-CM

## 2020-09-21 ENCOUNTER — Ambulatory Visit
Admit: 2020-09-21 | Discharge: 2020-09-21 | Payer: PRIVATE HEALTH INSURANCE | Attending: Registered" | Primary: Registered"

## 2020-09-21 ENCOUNTER — Ambulatory Visit: Admit: 2020-09-21 | Discharge: 2020-09-21 | Payer: PRIVATE HEALTH INSURANCE

## 2020-09-21 ENCOUNTER — Institutional Professional Consult (permissible substitution): Admit: 2020-09-21 | Discharge: 2020-09-21 | Payer: PRIVATE HEALTH INSURANCE

## 2020-09-21 ENCOUNTER — Ambulatory Visit
Admit: 2020-09-21 | Discharge: 2020-09-21 | Payer: PRIVATE HEALTH INSURANCE | Attending: Hematology & Oncology | Primary: Hematology & Oncology

## 2020-09-21 DIAGNOSIS — C800 Disseminated malignant neoplasm, unspecified: Principal | ICD-10-CM

## 2020-09-21 DIAGNOSIS — R197 Diarrhea, unspecified: Principal | ICD-10-CM

## 2020-09-21 DIAGNOSIS — R1031 Right lower quadrant pain: Principal | ICD-10-CM

## 2020-09-21 DIAGNOSIS — C187 Malignant neoplasm of sigmoid colon: Principal | ICD-10-CM

## 2020-09-21 MED ORDER — SLOW-MAG 71.5 MG TABLET,DELAYED RELEASE
ORAL_TABLET | Freq: Two times a day (BID) | ORAL | 0 refills | 15.00000 days
Start: 2020-09-21 — End: ?

## 2020-10-05 ENCOUNTER — Ambulatory Visit: Admit: 2020-10-05 | Discharge: 2020-10-05 | Payer: PRIVATE HEALTH INSURANCE

## 2020-10-05 DIAGNOSIS — C800 Disseminated malignant neoplasm, unspecified: Principal | ICD-10-CM

## 2020-10-05 DIAGNOSIS — C187 Malignant neoplasm of sigmoid colon: Principal | ICD-10-CM

## 2020-10-19 ENCOUNTER — Ambulatory Visit: Admit: 2020-10-19 | Discharge: 2020-10-20 | Payer: PRIVATE HEALTH INSURANCE

## 2020-10-20 ENCOUNTER — Other Ambulatory Visit: Admit: 2020-10-20 | Discharge: 2020-10-21 | Payer: PRIVATE HEALTH INSURANCE

## 2020-10-20 ENCOUNTER — Ambulatory Visit: Admit: 2020-10-20 | Discharge: 2020-10-21 | Payer: PRIVATE HEALTH INSURANCE

## 2020-10-20 ENCOUNTER — Ambulatory Visit
Admit: 2020-10-20 | Discharge: 2020-10-21 | Payer: PRIVATE HEALTH INSURANCE | Attending: Hematology & Oncology | Primary: Hematology & Oncology

## 2020-10-20 DIAGNOSIS — C800 Disseminated malignant neoplasm, unspecified: Principal | ICD-10-CM

## 2020-10-20 DIAGNOSIS — C187 Malignant neoplasm of sigmoid colon: Principal | ICD-10-CM

## 2020-11-02 ENCOUNTER — Ambulatory Visit: Admit: 2020-11-02 | Discharge: 2020-11-03 | Payer: PRIVATE HEALTH INSURANCE

## 2020-11-02 DIAGNOSIS — C800 Disseminated malignant neoplasm, unspecified: Principal | ICD-10-CM

## 2020-11-02 DIAGNOSIS — C187 Malignant neoplasm of sigmoid colon: Principal | ICD-10-CM

## 2020-11-16 ENCOUNTER — Ambulatory Visit: Admit: 2020-11-16 | Discharge: 2020-11-16 | Payer: PRIVATE HEALTH INSURANCE

## 2020-11-16 ENCOUNTER — Ambulatory Visit
Admit: 2020-11-16 | Discharge: 2020-11-16 | Payer: PRIVATE HEALTH INSURANCE | Attending: Hematology & Oncology | Primary: Hematology & Oncology

## 2020-11-16 ENCOUNTER — Institutional Professional Consult (permissible substitution): Admit: 2020-11-16 | Discharge: 2020-11-16 | Payer: PRIVATE HEALTH INSURANCE

## 2020-11-16 DIAGNOSIS — C800 Disseminated malignant neoplasm, unspecified: Principal | ICD-10-CM

## 2020-11-16 DIAGNOSIS — C187 Malignant neoplasm of sigmoid colon: Principal | ICD-10-CM

## 2020-12-01 ENCOUNTER — Ambulatory Visit: Admit: 2020-12-01 | Discharge: 2020-12-02 | Payer: PRIVATE HEALTH INSURANCE

## 2020-12-14 ENCOUNTER — Ambulatory Visit
Admit: 2020-12-14 | Discharge: 2020-12-14 | Payer: PRIVATE HEALTH INSURANCE | Attending: Hematology & Oncology | Primary: Hematology & Oncology

## 2020-12-14 ENCOUNTER — Institutional Professional Consult (permissible substitution): Admit: 2020-12-14 | Discharge: 2020-12-14 | Payer: PRIVATE HEALTH INSURANCE

## 2020-12-14 ENCOUNTER — Ambulatory Visit: Admit: 2020-12-14 | Discharge: 2020-12-14 | Payer: PRIVATE HEALTH INSURANCE

## 2020-12-14 DIAGNOSIS — C187 Malignant neoplasm of sigmoid colon: Principal | ICD-10-CM

## 2020-12-14 DIAGNOSIS — C800 Disseminated malignant neoplasm, unspecified: Principal | ICD-10-CM

## 2020-12-29 ENCOUNTER — Ambulatory Visit: Admit: 2020-12-29 | Discharge: 2020-12-30 | Payer: PRIVATE HEALTH INSURANCE

## 2020-12-29 DIAGNOSIS — C800 Disseminated malignant neoplasm, unspecified: Principal | ICD-10-CM

## 2020-12-29 DIAGNOSIS — C187 Malignant neoplasm of sigmoid colon: Principal | ICD-10-CM

## 2021-04-19 ENCOUNTER — Ambulatory Visit: Admit: 2021-04-19 | Discharge: 2021-04-20 | Payer: PRIVATE HEALTH INSURANCE

## 2021-04-19 ENCOUNTER — Ambulatory Visit
Admit: 2021-04-19 | Discharge: 2021-04-20 | Payer: PRIVATE HEALTH INSURANCE | Attending: Hematology & Oncology | Primary: Hematology & Oncology

## 2021-04-19 DIAGNOSIS — C187 Malignant neoplasm of sigmoid colon: Principal | ICD-10-CM

## 2021-05-21 ENCOUNTER — Ambulatory Visit: Admit: 2021-05-21 | Discharge: 2021-05-22 | Payer: PRIVATE HEALTH INSURANCE

## 2021-05-21 DIAGNOSIS — C187 Malignant neoplasm of sigmoid colon: Principal | ICD-10-CM

## 2021-05-31 ENCOUNTER — Institutional Professional Consult (permissible substitution): Admit: 2021-05-31 | Discharge: 2021-05-31 | Payer: PRIVATE HEALTH INSURANCE

## 2021-05-31 ENCOUNTER — Ambulatory Visit
Admit: 2021-05-31 | Discharge: 2021-05-31 | Payer: PRIVATE HEALTH INSURANCE | Attending: Hematology & Oncology | Primary: Hematology & Oncology

## 2021-05-31 DIAGNOSIS — C187 Malignant neoplasm of sigmoid colon: Principal | ICD-10-CM

## 2021-05-31 DIAGNOSIS — C800 Disseminated malignant neoplasm, unspecified: Principal | ICD-10-CM

## 2021-06-06 DIAGNOSIS — C187 Malignant neoplasm of sigmoid colon: Principal | ICD-10-CM

## 2021-07-24 DIAGNOSIS — C187 Malignant neoplasm of sigmoid colon: Principal | ICD-10-CM

## 2021-07-25 ENCOUNTER — Ambulatory Visit: Admit: 2021-07-25 | Discharge: 2021-07-25 | Payer: PRIVATE HEALTH INSURANCE

## 2021-07-25 DIAGNOSIS — I824Y2 Acute embolism and thrombosis of unspecified deep veins of left proximal lower extremity: Principal | ICD-10-CM

## 2021-07-25 DIAGNOSIS — C800 Disseminated malignant neoplasm, unspecified: Principal | ICD-10-CM

## 2021-07-25 DIAGNOSIS — C187 Malignant neoplasm of sigmoid colon: Principal | ICD-10-CM

## 2021-07-25 MED ORDER — CARVEDILOL 3.125 MG TABLET
ORAL_TABLET | Freq: Two times a day (BID) | ORAL | 11 refills | 30 days | Status: CP
Start: 2021-07-25 — End: 2022-07-25

## 2021-07-26 ENCOUNTER — Ambulatory Visit: Admit: 2021-07-26 | Discharge: 2021-07-27 | Payer: PRIVATE HEALTH INSURANCE

## 2021-08-02 ENCOUNTER — Ambulatory Visit: Admit: 2021-08-02 | Discharge: 2021-08-02 | Payer: PRIVATE HEALTH INSURANCE

## 2021-08-02 ENCOUNTER — Institutional Professional Consult (permissible substitution): Admit: 2021-08-02 | Discharge: 2021-08-02 | Payer: PRIVATE HEALTH INSURANCE

## 2021-08-02 ENCOUNTER — Ambulatory Visit
Admit: 2021-08-02 | Discharge: 2021-08-02 | Payer: PRIVATE HEALTH INSURANCE | Attending: Hematology & Oncology | Primary: Hematology & Oncology

## 2021-08-02 DIAGNOSIS — C187 Malignant neoplasm of sigmoid colon: Principal | ICD-10-CM

## 2021-08-02 DIAGNOSIS — C800 Disseminated malignant neoplasm, unspecified: Principal | ICD-10-CM

## 2021-08-02 MED ORDER — DOXYCYCLINE HYCLATE 100 MG CAPSULE
ORAL_CAPSULE | Freq: Two times a day (BID) | ORAL | 11 refills | 30 days | Status: CP
Start: 2021-08-02 — End: ?

## 2021-08-02 MED ORDER — SLOW-MAG 71.5 MG TABLET,DELAYED RELEASE
ORAL_TABLET | Freq: Two times a day (BID) | ORAL | 11 refills | 45 days | Status: CP
Start: 2021-08-02 — End: ?

## 2021-08-16 ENCOUNTER — Ambulatory Visit: Admit: 2021-08-16 | Discharge: 2021-08-17 | Payer: PRIVATE HEALTH INSURANCE

## 2021-08-16 DIAGNOSIS — C187 Malignant neoplasm of sigmoid colon: Principal | ICD-10-CM

## 2021-08-16 DIAGNOSIS — C800 Disseminated malignant neoplasm, unspecified: Principal | ICD-10-CM

## 2021-08-24 DIAGNOSIS — C187 Malignant neoplasm of sigmoid colon: Principal | ICD-10-CM

## 2021-08-30 ENCOUNTER — Ambulatory Visit
Admit: 2021-08-30 | Discharge: 2021-08-31 | Payer: PRIVATE HEALTH INSURANCE | Attending: Hematology & Oncology | Primary: Hematology & Oncology

## 2021-08-30 ENCOUNTER — Institutional Professional Consult (permissible substitution): Admit: 2021-08-30 | Discharge: 2021-08-31 | Payer: PRIVATE HEALTH INSURANCE

## 2021-08-30 ENCOUNTER — Ambulatory Visit: Admit: 2021-08-30 | Discharge: 2021-08-31 | Payer: PRIVATE HEALTH INSURANCE

## 2021-08-30 DIAGNOSIS — C187 Malignant neoplasm of sigmoid colon: Principal | ICD-10-CM

## 2021-08-30 DIAGNOSIS — C800 Disseminated malignant neoplasm, unspecified: Principal | ICD-10-CM

## 2021-08-30 DIAGNOSIS — D696 Thrombocytopenia, unspecified: Principal | ICD-10-CM

## 2021-09-13 ENCOUNTER — Ambulatory Visit: Admit: 2021-09-13 | Discharge: 2021-09-14 | Payer: PRIVATE HEALTH INSURANCE

## 2021-09-13 DIAGNOSIS — C187 Malignant neoplasm of sigmoid colon: Principal | ICD-10-CM

## 2021-09-13 DIAGNOSIS — C800 Disseminated malignant neoplasm, unspecified: Principal | ICD-10-CM

## 2021-09-24 ENCOUNTER — Ambulatory Visit: Admit: 2021-09-24 | Discharge: 2021-09-25 | Payer: PRIVATE HEALTH INSURANCE

## 2021-09-27 ENCOUNTER — Ambulatory Visit
Admit: 2021-09-27 | Discharge: 2021-09-27 | Payer: PRIVATE HEALTH INSURANCE | Attending: Hematology & Oncology | Primary: Hematology & Oncology

## 2021-09-27 ENCOUNTER — Ambulatory Visit: Admit: 2021-09-27 | Discharge: 2021-09-27 | Payer: PRIVATE HEALTH INSURANCE

## 2021-09-27 ENCOUNTER — Institutional Professional Consult (permissible substitution): Admit: 2021-09-27 | Discharge: 2021-09-27 | Payer: PRIVATE HEALTH INSURANCE

## 2021-09-27 DIAGNOSIS — C187 Malignant neoplasm of sigmoid colon: Principal | ICD-10-CM

## 2021-09-27 DIAGNOSIS — K55069 Acute infarction of intestine, part and extent unspecified: Principal | ICD-10-CM

## 2021-09-27 DIAGNOSIS — I8510 Secondary esophageal varices without bleeding: Principal | ICD-10-CM

## 2021-09-27 DIAGNOSIS — C800 Disseminated malignant neoplasm, unspecified: Principal | ICD-10-CM

## 2021-09-28 ENCOUNTER — Ambulatory Visit: Admit: 2021-09-28 | Discharge: 2021-09-29 | Payer: PRIVATE HEALTH INSURANCE

## 2021-09-28 DIAGNOSIS — C187 Malignant neoplasm of sigmoid colon: Principal | ICD-10-CM

## 2021-09-28 DIAGNOSIS — K766 Portal hypertension: Principal | ICD-10-CM

## 2021-10-05 ENCOUNTER — Ambulatory Visit: Admit: 2021-10-05 | Discharge: 2021-10-05 | Payer: PRIVATE HEALTH INSURANCE

## 2021-10-05 ENCOUNTER — Encounter
Admit: 2021-10-05 | Discharge: 2021-10-05 | Payer: PRIVATE HEALTH INSURANCE | Attending: Critical Care Medicine | Primary: Critical Care Medicine

## 2021-10-05 MED ORDER — SUCRALFATE 1 GRAM TABLET
ORAL_TABLET | 1 refills | 0 days | Status: CP
Start: 2021-10-05 — End: ?

## 2021-10-05 MED ORDER — PANTOPRAZOLE 40 MG TABLET,DELAYED RELEASE
ORAL_TABLET | Freq: Two times a day (BID) | ORAL | 1 refills | 30 days | Status: CP
Start: 2021-10-05 — End: 2022-10-05

## 2021-10-11 ENCOUNTER — Ambulatory Visit: Admit: 2021-10-11 | Discharge: 2021-10-12 | Payer: PRIVATE HEALTH INSURANCE

## 2021-10-11 DIAGNOSIS — C187 Malignant neoplasm of sigmoid colon: Principal | ICD-10-CM

## 2021-10-11 DIAGNOSIS — C800 Disseminated malignant neoplasm, unspecified: Principal | ICD-10-CM

## 2021-10-25 ENCOUNTER — Ambulatory Visit: Admit: 2021-10-25 | Discharge: 2021-10-25 | Payer: PRIVATE HEALTH INSURANCE

## 2021-10-25 ENCOUNTER — Institutional Professional Consult (permissible substitution): Admit: 2021-10-25 | Discharge: 2021-10-25 | Payer: PRIVATE HEALTH INSURANCE

## 2021-10-25 ENCOUNTER — Ambulatory Visit
Admit: 2021-10-25 | Discharge: 2021-10-25 | Payer: PRIVATE HEALTH INSURANCE | Attending: Hematology & Oncology | Primary: Hematology & Oncology

## 2021-10-25 DIAGNOSIS — C800 Disseminated malignant neoplasm, unspecified: Principal | ICD-10-CM

## 2021-10-25 DIAGNOSIS — C187 Malignant neoplasm of sigmoid colon: Principal | ICD-10-CM

## 2021-10-30 ENCOUNTER — Encounter: Admit: 2021-10-30 | Discharge: 2021-10-30 | Payer: PRIVATE HEALTH INSURANCE

## 2021-10-30 ENCOUNTER — Ambulatory Visit: Admit: 2021-10-30 | Discharge: 2021-10-30 | Payer: PRIVATE HEALTH INSURANCE

## 2021-11-08 ENCOUNTER — Ambulatory Visit: Admit: 2021-11-08 | Discharge: 2021-11-09 | Payer: PRIVATE HEALTH INSURANCE

## 2021-11-08 DIAGNOSIS — C800 Disseminated malignant neoplasm, unspecified: Principal | ICD-10-CM

## 2021-11-08 DIAGNOSIS — C187 Malignant neoplasm of sigmoid colon: Principal | ICD-10-CM

## 2021-11-19 DIAGNOSIS — C187 Malignant neoplasm of sigmoid colon: Principal | ICD-10-CM

## 2021-11-20 ENCOUNTER — Ambulatory Visit: Admit: 2021-11-20 | Discharge: 2021-11-21 | Payer: PRIVATE HEALTH INSURANCE

## 2021-11-20 MED ORDER — APIXABAN 5 MG TABLET
ORAL_TABLET | Freq: Two times a day (BID) | ORAL | 11 refills | 30 days | Status: CP
Start: 2021-11-20 — End: ?

## 2021-11-22 ENCOUNTER — Ambulatory Visit
Admit: 2021-11-22 | Discharge: 2021-11-22 | Payer: PRIVATE HEALTH INSURANCE | Attending: Hematology & Oncology | Primary: Hematology & Oncology

## 2021-11-22 ENCOUNTER — Institutional Professional Consult (permissible substitution): Admit: 2021-11-22 | Discharge: 2021-11-22 | Payer: PRIVATE HEALTH INSURANCE

## 2021-11-22 ENCOUNTER — Ambulatory Visit: Admit: 2021-11-22 | Discharge: 2021-11-22 | Payer: PRIVATE HEALTH INSURANCE

## 2021-11-22 DIAGNOSIS — C187 Malignant neoplasm of sigmoid colon: Principal | ICD-10-CM

## 2021-11-22 DIAGNOSIS — C800 Disseminated malignant neoplasm, unspecified: Principal | ICD-10-CM

## 2021-12-06 ENCOUNTER — Ambulatory Visit: Admit: 2021-12-06 | Discharge: 2021-12-07 | Payer: PRIVATE HEALTH INSURANCE

## 2021-12-06 DIAGNOSIS — C187 Malignant neoplasm of sigmoid colon: Principal | ICD-10-CM

## 2021-12-06 DIAGNOSIS — C800 Disseminated malignant neoplasm, unspecified: Principal | ICD-10-CM

## 2021-12-20 ENCOUNTER — Ambulatory Visit: Admit: 2021-12-20 | Discharge: 2021-12-20 | Payer: PRIVATE HEALTH INSURANCE

## 2021-12-20 ENCOUNTER — Institutional Professional Consult (permissible substitution): Admit: 2021-12-20 | Discharge: 2021-12-20 | Payer: PRIVATE HEALTH INSURANCE

## 2021-12-20 DIAGNOSIS — K55069 Acute infarction of intestine, part and extent unspecified: Principal | ICD-10-CM

## 2021-12-20 DIAGNOSIS — C187 Malignant neoplasm of sigmoid colon: Principal | ICD-10-CM

## 2021-12-20 DIAGNOSIS — I8510 Secondary esophageal varices without bleeding: Principal | ICD-10-CM

## 2021-12-20 DIAGNOSIS — R197 Diarrhea, unspecified: Principal | ICD-10-CM

## 2021-12-20 DIAGNOSIS — C800 Disseminated malignant neoplasm, unspecified: Principal | ICD-10-CM

## 2021-12-20 DIAGNOSIS — R21 Rash and other nonspecific skin eruption: Principal | ICD-10-CM

## 2022-01-03 ENCOUNTER — Ambulatory Visit: Admit: 2022-01-03 | Discharge: 2022-01-04 | Payer: PRIVATE HEALTH INSURANCE

## 2022-01-03 DIAGNOSIS — C187 Malignant neoplasm of sigmoid colon: Principal | ICD-10-CM

## 2022-01-03 DIAGNOSIS — C800 Disseminated malignant neoplasm, unspecified: Principal | ICD-10-CM

## 2022-01-14 DIAGNOSIS — C800 Disseminated malignant neoplasm, unspecified: Principal | ICD-10-CM

## 2022-01-14 DIAGNOSIS — C187 Malignant neoplasm of sigmoid colon: Principal | ICD-10-CM

## 2022-01-15 ENCOUNTER — Ambulatory Visit: Admit: 2022-01-15 | Discharge: 2022-01-16 | Payer: PRIVATE HEALTH INSURANCE

## 2022-01-17 ENCOUNTER — Ambulatory Visit
Admit: 2022-01-17 | Discharge: 2022-01-18 | Payer: PRIVATE HEALTH INSURANCE | Attending: Hematology & Oncology | Primary: Hematology & Oncology

## 2022-01-17 ENCOUNTER — Institutional Professional Consult (permissible substitution): Admit: 2022-01-17 | Discharge: 2022-01-17 | Payer: PRIVATE HEALTH INSURANCE

## 2022-01-17 ENCOUNTER — Ambulatory Visit: Admit: 2022-01-17 | Discharge: 2022-01-17 | Payer: PRIVATE HEALTH INSURANCE

## 2022-01-17 DIAGNOSIS — C187 Malignant neoplasm of sigmoid colon: Principal | ICD-10-CM

## 2022-01-17 DIAGNOSIS — C800 Disseminated malignant neoplasm, unspecified: Principal | ICD-10-CM

## 2022-02-02 ENCOUNTER — Ambulatory Visit: Admit: 2022-02-02 | Discharge: 2022-02-03 | Payer: MEDICARE

## 2022-02-02 DIAGNOSIS — C187 Malignant neoplasm of sigmoid colon: Principal | ICD-10-CM

## 2022-02-02 DIAGNOSIS — C800 Disseminated malignant neoplasm, unspecified: Principal | ICD-10-CM

## 2022-02-14 ENCOUNTER — Telehealth
Admit: 2022-02-14 | Discharge: 2022-02-15 | Payer: MEDICARE | Attending: Hematology & Oncology | Primary: Hematology & Oncology

## 2022-02-14 ENCOUNTER — Ambulatory Visit: Admit: 2022-02-14 | Discharge: 2022-02-15 | Payer: MEDICARE

## 2022-02-14 DIAGNOSIS — C800 Disseminated malignant neoplasm, unspecified: Principal | ICD-10-CM

## 2022-02-14 DIAGNOSIS — Z79899 Other long term (current) drug therapy: Principal | ICD-10-CM

## 2022-02-14 DIAGNOSIS — C187 Malignant neoplasm of sigmoid colon: Principal | ICD-10-CM

## 2022-02-16 ENCOUNTER — Ambulatory Visit: Admit: 2022-02-16 | Discharge: 2022-02-17 | Payer: MEDICARE

## 2022-03-03 ENCOUNTER — Ambulatory Visit: Admit: 2022-03-03 | Discharge: 2022-03-03 | Payer: MEDICARE

## 2022-03-16 ENCOUNTER — Ambulatory Visit: Admit: 2022-03-16 | Discharge: 2022-03-17 | Payer: MEDICARE

## 2022-03-28 DIAGNOSIS — C187 Malignant neoplasm of sigmoid colon: Principal | ICD-10-CM

## 2022-03-28 DIAGNOSIS — Z79899 Other long term (current) drug therapy: Principal | ICD-10-CM

## 2022-03-28 DIAGNOSIS — C800 Disseminated malignant neoplasm, unspecified: Principal | ICD-10-CM

## 2022-03-30 ENCOUNTER — Ambulatory Visit: Admit: 2022-03-30 | Discharge: 2022-03-31 | Payer: MEDICARE

## 2022-04-05 DIAGNOSIS — C187 Malignant neoplasm of sigmoid colon: Principal | ICD-10-CM

## 2022-04-06 ENCOUNTER — Ambulatory Visit: Admit: 2022-04-06 | Discharge: 2022-04-07 | Payer: MEDICARE

## 2022-04-11 ENCOUNTER — Telehealth
Admit: 2022-04-11 | Discharge: 2022-04-12 | Payer: MEDICARE | Attending: Hematology & Oncology | Primary: Hematology & Oncology

## 2022-04-11 ENCOUNTER — Ambulatory Visit: Admit: 2022-04-11 | Discharge: 2022-04-12 | Payer: MEDICARE

## 2022-04-11 DIAGNOSIS — C187 Malignant neoplasm of sigmoid colon: Principal | ICD-10-CM

## 2022-04-11 DIAGNOSIS — C800 Disseminated malignant neoplasm, unspecified: Principal | ICD-10-CM

## 2022-04-11 DIAGNOSIS — N1330 Unspecified hydronephrosis: Principal | ICD-10-CM

## 2022-04-11 DIAGNOSIS — Z79899 Other long term (current) drug therapy: Principal | ICD-10-CM

## 2022-04-13 ENCOUNTER — Ambulatory Visit: Admit: 2022-04-13 | Discharge: 2022-04-14 | Payer: MEDICARE

## 2022-04-24 DIAGNOSIS — R310 Gross hematuria: Principal | ICD-10-CM

## 2022-04-24 DIAGNOSIS — N1330 Unspecified hydronephrosis: Principal | ICD-10-CM

## 2022-04-27 ENCOUNTER — Ambulatory Visit: Admit: 2022-04-27 | Discharge: 2022-04-28 | Payer: MEDICARE

## 2022-05-11 ENCOUNTER — Ambulatory Visit: Admit: 2022-05-11 | Discharge: 2022-05-12 | Payer: BLUE CROSS/BLUE SHIELD

## 2022-05-14 ENCOUNTER — Ambulatory Visit
Admit: 2022-05-14 | Discharge: 2022-05-14 | Disposition: A | Discharge status: Home / Self Care | Payer: PRIVATE HEALTH INSURANCE

## 2022-05-14 ENCOUNTER — Encounter
Admit: 2022-05-14 | Discharge: 2022-05-14 | Disposition: A | Discharge status: Home / Self Care | Payer: PRIVATE HEALTH INSURANCE | Attending: Certified Registered" | Primary: Certified Registered"

## 2022-05-14 ENCOUNTER — Ambulatory Visit: Admit: 2022-05-14 | Discharge: 2022-05-14 | Payer: PRIVATE HEALTH INSURANCE

## 2022-05-14 DIAGNOSIS — N138 Other obstructive and reflux uropathy: Principal | ICD-10-CM

## 2022-05-14 DIAGNOSIS — N200 Calculus of kidney: Principal | ICD-10-CM

## 2022-05-14 DIAGNOSIS — R7989 Other specified abnormal findings of blood chemistry: Principal | ICD-10-CM

## 2022-05-14 MED ORDER — ACETAMINOPHEN 500 MG TABLET
ORAL_TABLET | Freq: Three times a day (TID) | ORAL | 0 refills | 7 days | Status: CP
Start: 2022-05-14 — End: 2022-05-21
  Filled 2022-05-14: qty 42, 7d supply, fill #0

## 2022-05-14 MED ORDER — TAMSULOSIN 0.4 MG CAPSULE
ORAL_CAPSULE | Freq: Every day | ORAL | 0 refills | 30 days | Status: CP
Start: 2022-05-14 — End: 2022-06-13
  Filled 2022-05-14: qty 30, 30d supply, fill #0

## 2022-05-14 MED ORDER — PHENAZOPYRIDINE 200 MG TABLET
ORAL_TABLET | Freq: Three times a day (TID) | ORAL | 0 refills | 4 days | Status: CP | PRN
Start: 2022-05-14 — End: 2022-05-17
  Filled 2022-05-14: qty 4, 2d supply, fill #0

## 2022-05-25 ENCOUNTER — Ambulatory Visit: Admit: 2022-05-25 | Discharge: 2022-05-26 | Payer: PRIVATE HEALTH INSURANCE

## 2022-05-30 ENCOUNTER — Telehealth
Admit: 2022-05-30 | Discharge: 2022-05-31 | Payer: PRIVATE HEALTH INSURANCE | Attending: Hematology & Oncology | Primary: Hematology & Oncology

## 2022-05-30 DIAGNOSIS — C187 Malignant neoplasm of sigmoid colon: Principal | ICD-10-CM

## 2022-05-30 DIAGNOSIS — C800 Disseminated malignant neoplasm, unspecified: Principal | ICD-10-CM

## 2022-05-30 DIAGNOSIS — Z79899 Other long term (current) drug therapy: Principal | ICD-10-CM

## 2022-06-07 DIAGNOSIS — C187 Malignant neoplasm of sigmoid colon: Principal | ICD-10-CM

## 2022-06-08 ENCOUNTER — Ambulatory Visit: Admit: 2022-06-08 | Discharge: 2022-06-09 | Payer: PRIVATE HEALTH INSURANCE

## 2022-06-21 ENCOUNTER — Ambulatory Visit
Admit: 2022-06-21 | Discharge: 2022-06-22 | Payer: BLUE CROSS/BLUE SHIELD | Attending: Student in an Organized Health Care Education/Training Program | Primary: Student in an Organized Health Care Education/Training Program

## 2022-06-21 DIAGNOSIS — J329 Chronic sinusitis, unspecified: Principal | ICD-10-CM

## 2022-06-21 MED ORDER — COLCHICINE 0.6 MG CAPSULE
ORAL_CAPSULE | 3 refills | 0 days | Status: CP
Start: 2022-06-21 — End: ?

## 2022-06-21 MED ORDER — AMOXICILLIN 875 MG-POTASSIUM CLAVULANATE 125 MG TABLET
ORAL_TABLET | Freq: Two times a day (BID) | ORAL | 0 refills | 7 days | Status: CP
Start: 2022-06-21 — End: 2022-06-28

## 2022-06-21 MED ORDER — ALLOPURINOL 100 MG TABLET
ORAL_TABLET | Freq: Every day | ORAL | 2 refills | 30 days | Status: CP
Start: 2022-06-21 — End: 2023-06-21

## 2022-06-22 ENCOUNTER — Ambulatory Visit: Admit: 2022-06-22 | Discharge: 2022-06-23 | Payer: BLUE CROSS/BLUE SHIELD

## 2022-07-05 MED ORDER — TAMSULOSIN 0.4 MG CAPSULE
ORAL_CAPSULE | Freq: Every day | ORAL | 3 refills | 90 days | Status: CP
Start: 2022-07-05 — End: 2023-07-05

## 2022-07-06 ENCOUNTER — Ambulatory Visit: Admit: 2022-07-06 | Discharge: 2022-07-07 | Payer: PRIVATE HEALTH INSURANCE

## 2022-07-11 ENCOUNTER — Institutional Professional Consult (permissible substitution): Admit: 2022-07-11 | Discharge: 2022-07-11 | Payer: PRIVATE HEALTH INSURANCE

## 2022-07-11 ENCOUNTER — Ambulatory Visit
Admit: 2022-07-11 | Discharge: 2022-07-11 | Payer: PRIVATE HEALTH INSURANCE | Attending: Hematology & Oncology | Primary: Hematology & Oncology

## 2022-07-11 ENCOUNTER — Ambulatory Visit: Admit: 2022-07-11 | Discharge: 2022-07-11 | Payer: PRIVATE HEALTH INSURANCE

## 2022-07-11 DIAGNOSIS — D649 Anemia, unspecified: Principal | ICD-10-CM

## 2022-07-11 DIAGNOSIS — C800 Disseminated malignant neoplasm, unspecified: Principal | ICD-10-CM

## 2022-07-11 DIAGNOSIS — Z79899 Other long term (current) drug therapy: Principal | ICD-10-CM

## 2022-07-11 DIAGNOSIS — C187 Malignant neoplasm of sigmoid colon: Principal | ICD-10-CM

## 2022-07-12 ENCOUNTER — Ambulatory Visit
Admit: 2022-07-12 | Discharge: 2022-07-14 | Disposition: A | Discharge status: Home / Self Care | Payer: PRIVATE HEALTH INSURANCE | Admitting: Medical Oncology

## 2022-07-12 ENCOUNTER — Encounter
Admit: 2022-07-12 | Discharge: 2022-07-14 | Disposition: A | Discharge status: Home / Self Care | Payer: PRIVATE HEALTH INSURANCE | Admitting: Medical Oncology

## 2022-07-12 ENCOUNTER — Encounter: Admit: 2022-07-12 | Discharge: 2022-07-14 | Payer: PRIVATE HEALTH INSURANCE

## 2022-07-12 DIAGNOSIS — C187 Malignant neoplasm of sigmoid colon: Principal | ICD-10-CM

## 2022-07-14 MED ORDER — SODIUM CHLORIDE 0.9 % (FLUSH) INJECTION SYRINGE
2 refills | 0.00000 days | Status: CP
Start: 2022-07-14 — End: 2022-07-14
  Filled 2022-07-14: qty 1200, 60d supply, fill #0

## 2022-07-16 DIAGNOSIS — R339 Retention of urine, unspecified: Principal | ICD-10-CM

## 2022-07-17 DIAGNOSIS — Z79899 Other long term (current) drug therapy: Principal | ICD-10-CM

## 2022-07-17 DIAGNOSIS — C800 Disseminated malignant neoplasm, unspecified: Principal | ICD-10-CM

## 2022-07-17 DIAGNOSIS — C187 Malignant neoplasm of sigmoid colon: Principal | ICD-10-CM

## 2022-07-17 DIAGNOSIS — D500 Iron deficiency anemia secondary to blood loss (chronic): Principal | ICD-10-CM

## 2022-07-19 DIAGNOSIS — C187 Malignant neoplasm of sigmoid colon: Principal | ICD-10-CM

## 2022-07-20 ENCOUNTER — Encounter
Admit: 2022-07-20 | Discharge: 2022-07-21 | Disposition: A | Discharge status: Home / Self Care | Payer: PRIVATE HEALTH INSURANCE | Attending: Student in an Organized Health Care Education/Training Program | Admitting: Student in an Organized Health Care Education/Training Program

## 2022-07-20 ENCOUNTER — Ambulatory Visit
Admit: 2022-07-20 | Discharge: 2022-07-21 | Disposition: A | Discharge status: Home / Self Care | Payer: PRIVATE HEALTH INSURANCE | Admitting: Student in an Organized Health Care Education/Training Program

## 2022-07-20 ENCOUNTER — Encounter
Admit: 2022-07-20 | Discharge: 2022-07-21 | Disposition: A | Discharge status: Home / Self Care | Payer: PRIVATE HEALTH INSURANCE | Attending: Hematology & Oncology | Admitting: Student in an Organized Health Care Education/Training Program

## 2022-07-22 DIAGNOSIS — Z79899 Other long term (current) drug therapy: Principal | ICD-10-CM

## 2022-07-22 DIAGNOSIS — C187 Malignant neoplasm of sigmoid colon: Principal | ICD-10-CM

## 2022-07-22 DIAGNOSIS — C800 Disseminated malignant neoplasm, unspecified: Principal | ICD-10-CM

## 2022-07-25 ENCOUNTER — Ambulatory Visit: Admit: 2022-07-25 | Discharge: 2022-07-25 | Payer: MEDICARE

## 2022-07-25 ENCOUNTER — Ambulatory Visit
Admit: 2022-07-25 | Discharge: 2022-07-25 | Payer: MEDICARE | Attending: Student in an Organized Health Care Education/Training Program | Primary: Student in an Organized Health Care Education/Training Program

## 2022-07-25 ENCOUNTER — Institutional Professional Consult (permissible substitution): Admit: 2022-07-25 | Discharge: 2022-07-25 | Payer: BLUE CROSS/BLUE SHIELD

## 2022-07-25 DIAGNOSIS — D649 Anemia, unspecified: Principal | ICD-10-CM

## 2022-07-25 DIAGNOSIS — C187 Malignant neoplasm of sigmoid colon: Principal | ICD-10-CM

## 2022-07-25 DIAGNOSIS — Z79899 Other long term (current) drug therapy: Principal | ICD-10-CM

## 2022-07-25 DIAGNOSIS — N1330 Unspecified hydronephrosis: Principal | ICD-10-CM

## 2022-07-25 DIAGNOSIS — R310 Gross hematuria: Principal | ICD-10-CM

## 2022-07-25 DIAGNOSIS — C800 Disseminated malignant neoplasm, unspecified: Principal | ICD-10-CM

## 2022-07-25 DIAGNOSIS — D500 Iron deficiency anemia secondary to blood loss (chronic): Principal | ICD-10-CM

## 2022-07-29 ENCOUNTER — Encounter
Admit: 2022-07-29 | Discharge: 2022-07-29 | Payer: BLUE CROSS/BLUE SHIELD | Attending: Student in an Organized Health Care Education/Training Program | Primary: Student in an Organized Health Care Education/Training Program

## 2022-07-29 ENCOUNTER — Ambulatory Visit: Admit: 2022-07-29 | Discharge: 2022-07-29 | Payer: BLUE CROSS/BLUE SHIELD

## 2022-07-29 ENCOUNTER — Ambulatory Visit: Admit: 2022-07-29 | Discharge: 2022-07-29 | Payer: PRIVATE HEALTH INSURANCE

## 2022-07-29 ENCOUNTER — Encounter: Admit: 2022-07-29 | Discharge: 2022-07-29 | Payer: MEDICARE | Attending: Anesthesiology | Primary: Anesthesiology

## 2022-07-29 MED ORDER — TAMSULOSIN 0.4 MG CAPSULE
ORAL_CAPSULE | Freq: Every day | ORAL | 3 refills | 90 days | Status: CP
Start: 2022-07-29 — End: 2023-07-29
  Filled 2022-07-29: qty 90, 90d supply, fill #0

## 2022-07-29 MED ORDER — ACETAMINOPHEN 500 MG TABLET
ORAL_TABLET | Freq: Three times a day (TID) | ORAL | 0 refills | 7 days | Status: CP
Start: 2022-07-29 — End: 2022-08-05
  Filled 2022-07-29: qty 42, 7d supply, fill #0

## 2022-07-29 MED ORDER — PHENAZOPYRIDINE 100 MG TABLET
ORAL_TABLET | Freq: Three times a day (TID) | ORAL | 0 refills | 2 days | Status: CP | PRN
Start: 2022-07-29 — End: 2022-07-31
  Filled 2022-07-29: qty 12, 2d supply, fill #0

## 2022-07-31 ENCOUNTER — Ambulatory Visit
Admit: 2022-07-31 | Discharge: 2022-08-03 | Disposition: A | Discharge status: Home / Self Care | Payer: PRIVATE HEALTH INSURANCE | Admitting: Student in an Organized Health Care Education/Training Program

## 2022-07-31 ENCOUNTER — Encounter
Admit: 2022-07-31 | Discharge: 2022-08-03 | Disposition: A | Discharge status: Home / Self Care | Payer: PRIVATE HEALTH INSURANCE | Attending: Hematology & Oncology | Admitting: Student in an Organized Health Care Education/Training Program

## 2022-07-31 ENCOUNTER — Encounter
Admit: 2022-07-31 | Discharge: 2022-08-03 | Disposition: A | Discharge status: Home / Self Care | Payer: BLUE CROSS/BLUE SHIELD | Admitting: Student in an Organized Health Care Education/Training Program

## 2022-07-31 ENCOUNTER — Ambulatory Visit: Admit: 2022-07-31 | Discharge: 2022-08-03 | Payer: PRIVATE HEALTH INSURANCE

## 2022-08-02 DIAGNOSIS — C187 Malignant neoplasm of sigmoid colon: Principal | ICD-10-CM

## 2022-08-03 DIAGNOSIS — N1330 Unspecified hydronephrosis: Principal | ICD-10-CM

## 2022-08-03 MED ORDER — CARVEDILOL 3.125 MG TABLET
ORAL_TABLET | Freq: Two times a day (BID) | ORAL | 0 refills | 30 days | Status: CP
Start: 2022-08-03 — End: 2022-09-02
  Filled 2022-08-03: qty 60, 30d supply, fill #0

## 2022-08-03 MED ORDER — CIPROFLOXACIN 500 MG TABLET
ORAL_TABLET | Freq: Two times a day (BID) | ORAL | 0 refills | 10 days | Status: CP
Start: 2022-08-03 — End: 2022-08-13
  Filled 2022-08-03: qty 20, 10d supply, fill #0

## 2022-08-03 MED ORDER — IBUPROFEN 400 MG TABLET
ORAL_TABLET | Freq: Four times a day (QID) | ORAL | 0 refills | 10 days | Status: CP | PRN
Start: 2022-08-03 — End: 2022-08-13
  Filled 2022-08-03: qty 40, 10d supply, fill #0

## 2022-08-05 DIAGNOSIS — Z09 Encounter for follow-up examination after completed treatment for conditions other than malignant neoplasm: Principal | ICD-10-CM

## 2022-08-06 ENCOUNTER — Ambulatory Visit
Admit: 2022-08-06 | Discharge: 2022-08-07 | Payer: PRIVATE HEALTH INSURANCE | Attending: Nurse Practitioner | Primary: Nurse Practitioner

## 2022-08-06 ENCOUNTER — Ambulatory Visit: Admit: 2022-08-06 | Discharge: 2022-08-07 | Payer: PRIVATE HEALTH INSURANCE

## 2022-08-08 ENCOUNTER — Telehealth
Admit: 2022-08-08 | Discharge: 2022-08-09 | Payer: PRIVATE HEALTH INSURANCE | Attending: Hematology & Oncology | Primary: Hematology & Oncology

## 2022-08-08 DIAGNOSIS — C800 Disseminated malignant neoplasm, unspecified: Principal | ICD-10-CM

## 2022-08-08 DIAGNOSIS — Z79899 Other long term (current) drug therapy: Principal | ICD-10-CM

## 2022-08-08 DIAGNOSIS — C187 Malignant neoplasm of sigmoid colon: Principal | ICD-10-CM

## 2022-08-10 ENCOUNTER — Encounter: Admit: 2022-08-10 | Discharge: 2022-08-10 | Payer: PRIVATE HEALTH INSURANCE

## 2022-08-10 ENCOUNTER — Ambulatory Visit: Admit: 2022-08-10 | Discharge: 2022-08-10 | Payer: PRIVATE HEALTH INSURANCE

## 2022-08-19 ENCOUNTER — Ambulatory Visit: Admit: 2022-08-19 | Discharge: 2022-08-20 | Payer: PRIVATE HEALTH INSURANCE

## 2022-08-21 DIAGNOSIS — N1330 Unspecified hydronephrosis: Principal | ICD-10-CM

## 2022-08-21 DIAGNOSIS — N135 Crossing vessel and stricture of ureter without hydronephrosis: Principal | ICD-10-CM

## 2022-08-24 ENCOUNTER — Ambulatory Visit: Admit: 2022-08-24 | Discharge: 2022-08-25 | Payer: BLUE CROSS/BLUE SHIELD

## 2022-08-28 ENCOUNTER — Ambulatory Visit
Admit: 2022-08-28 | Discharge: 2022-08-28 | Disposition: A | Discharge status: Home / Self Care | Payer: MEDICARE | Admitting: Hematology & Oncology

## 2022-08-29 ENCOUNTER — Telehealth
Admit: 2022-08-29 | Discharge: 2022-08-30 | Payer: MEDICARE | Attending: Hematology & Oncology | Primary: Hematology & Oncology

## 2022-08-29 DIAGNOSIS — C187 Malignant neoplasm of sigmoid colon: Principal | ICD-10-CM

## 2022-08-29 DIAGNOSIS — Z79899 Other long term (current) drug therapy: Principal | ICD-10-CM

## 2022-08-29 DIAGNOSIS — C800 Disseminated malignant neoplasm, unspecified: Principal | ICD-10-CM

## 2022-08-30 ENCOUNTER — Emergency Department: Admit: 2022-08-30 | Discharge: 2022-08-30 | Disposition: A | Discharge status: Home / Self Care | Payer: MEDICARE

## 2022-08-30 ENCOUNTER — Ambulatory Visit: Admit: 2022-08-30 | Discharge: 2022-08-30 | Disposition: A | Discharge status: Home / Self Care | Payer: MEDICARE

## 2022-09-07 ENCOUNTER — Ambulatory Visit: Admit: 2022-09-07 | Discharge: 2022-09-08 | Payer: MEDICARE

## 2022-09-16 ENCOUNTER — Emergency Department
Admit: 2022-09-16 | Discharge: 2022-09-16 | Disposition: A | Discharge status: Home / Self Care | Payer: PRIVATE HEALTH INSURANCE

## 2022-09-16 ENCOUNTER — Ambulatory Visit
Admit: 2022-09-16 | Discharge: 2022-09-16 | Disposition: A | Discharge status: Home / Self Care | Payer: PRIVATE HEALTH INSURANCE

## 2022-09-16 MED ORDER — CIPROFLOXACIN 500 MG TABLET
ORAL_TABLET | Freq: Two times a day (BID) | ORAL | 0 refills | 7 days | Status: CP
Start: 2022-09-16 — End: 2022-09-23

## 2022-09-21 ENCOUNTER — Ambulatory Visit: Admit: 2022-09-21 | Discharge: 2022-09-22 | Payer: PRIVATE HEALTH INSURANCE

## 2022-09-23 DIAGNOSIS — C187 Malignant neoplasm of sigmoid colon: Principal | ICD-10-CM

## 2022-09-26 ENCOUNTER — Telehealth
Admit: 2022-09-26 | Discharge: 2022-09-27 | Payer: PRIVATE HEALTH INSURANCE | Attending: Hematology & Oncology | Primary: Hematology & Oncology

## 2022-09-26 DIAGNOSIS — C187 Malignant neoplasm of sigmoid colon: Principal | ICD-10-CM

## 2022-09-26 DIAGNOSIS — Z79899 Other long term (current) drug therapy: Principal | ICD-10-CM

## 2022-09-26 DIAGNOSIS — C800 Disseminated malignant neoplasm, unspecified: Principal | ICD-10-CM

## 2022-09-26 DIAGNOSIS — N1831 Stage 3a chronic kidney disease (CMS-HCC): Principal | ICD-10-CM

## 2022-10-05 ENCOUNTER — Ambulatory Visit: Admit: 2022-10-05 | Discharge: 2022-10-06 | Payer: MEDICARE

## 2022-10-08 DIAGNOSIS — N135 Crossing vessel and stricture of ureter without hydronephrosis: Principal | ICD-10-CM

## 2022-10-09 ENCOUNTER — Ambulatory Visit
Admit: 2022-10-09 | Discharge: 2022-10-09 | Payer: MEDICARE | Attending: Nurse Practitioner | Primary: Nurse Practitioner

## 2022-10-09 ENCOUNTER — Ambulatory Visit: Admit: 2022-10-09 | Discharge: 2022-10-09 | Payer: MEDICARE

## 2022-10-09 ENCOUNTER — Ambulatory Visit: Admit: 2022-10-09 | Discharge: 2022-10-09 | Payer: BLUE CROSS/BLUE SHIELD

## 2022-10-09 DIAGNOSIS — N135 Crossing vessel and stricture of ureter without hydronephrosis: Principal | ICD-10-CM

## 2022-10-11 DIAGNOSIS — N135 Crossing vessel and stricture of ureter without hydronephrosis: Principal | ICD-10-CM

## 2022-10-11 MED ORDER — CIPROFLOXACIN 500 MG TABLET
ORAL_TABLET | Freq: Two times a day (BID) | ORAL | 0 refills | 7 days | Status: CP
Start: 2022-10-11 — End: 2022-10-18

## 2022-10-11 MED ORDER — CEFDINIR 300 MG CAPSULE
ORAL_CAPSULE | Freq: Two times a day (BID) | ORAL | 0 refills | 7 days | Status: CP
Start: 2022-10-11 — End: 2022-10-11

## 2022-10-14 ENCOUNTER — Ambulatory Visit: Admit: 2022-10-14 | Discharge: 2022-10-14 | Payer: MEDICARE

## 2022-10-14 ENCOUNTER — Encounter: Admit: 2022-10-14 | Discharge: 2022-10-14 | Payer: MEDICARE | Attending: Anesthesiology | Primary: Anesthesiology

## 2022-10-14 ENCOUNTER — Encounter
Admit: 2022-10-14 | Discharge: 2022-10-14 | Payer: BLUE CROSS/BLUE SHIELD | Attending: Anesthesiology | Primary: Anesthesiology

## 2022-10-14 DIAGNOSIS — N135 Crossing vessel and stricture of ureter without hydronephrosis: Principal | ICD-10-CM

## 2022-10-14 DIAGNOSIS — R9341 Abnormal radiologic findings on diagnostic imaging of renal pelvis, ureter, or bladder: Principal | ICD-10-CM

## 2022-10-14 MED ORDER — TAMSULOSIN 0.4 MG CAPSULE
ORAL_CAPSULE | Freq: Every day | ORAL | 0 refills | 30 days | Status: CP
Start: 2022-10-14 — End: 2022-11-13

## 2022-10-14 MED ORDER — PHENAZOPYRIDINE 200 MG TABLET
ORAL_TABLET | Freq: Three times a day (TID) | ORAL | 0 refills | 4.00000 days | Status: CP | PRN
Start: 2022-10-14 — End: 2022-10-17

## 2022-10-14 MED ORDER — ACETAMINOPHEN 500 MG TABLET
ORAL_TABLET | Freq: Three times a day (TID) | ORAL | 0 refills | 7 days | Status: CP
Start: 2022-10-14 — End: 2022-10-21

## 2022-10-15 MED ORDER — PHENAZOPYRIDINE 200 MG TABLET
ORAL_TABLET | Freq: Three times a day (TID) | ORAL | 0 refills | 4 days | Status: CP | PRN
Start: 2022-10-15 — End: 2022-10-18

## 2022-10-19 ENCOUNTER — Ambulatory Visit: Admit: 2022-10-19 | Discharge: 2022-10-20 | Payer: MEDICARE

## 2022-10-19 DIAGNOSIS — E876 Hypokalemia: Principal | ICD-10-CM

## 2022-10-23 ENCOUNTER — Ambulatory Visit: Admit: 2022-10-23 | Discharge: 2022-10-24 | Payer: MEDICARE

## 2022-10-23 ENCOUNTER — Ambulatory Visit: Admit: 2022-10-23 | Discharge: 2022-10-24 | Payer: BLUE CROSS/BLUE SHIELD

## 2022-10-24 ENCOUNTER — Ambulatory Visit
Admit: 2022-10-24 | Discharge: 2022-10-25 | Payer: BLUE CROSS/BLUE SHIELD | Attending: Hematology & Oncology | Primary: Hematology & Oncology

## 2022-10-24 DIAGNOSIS — Z79899 Other long term (current) drug therapy: Principal | ICD-10-CM

## 2022-10-24 DIAGNOSIS — C800 Disseminated malignant neoplasm, unspecified: Principal | ICD-10-CM

## 2022-10-24 DIAGNOSIS — C187 Malignant neoplasm of sigmoid colon: Principal | ICD-10-CM

## 2022-11-02 ENCOUNTER — Ambulatory Visit: Admit: 2022-11-02 | Discharge: 2022-11-03 | Payer: PRIVATE HEALTH INSURANCE

## 2022-11-05 ENCOUNTER — Ambulatory Visit: Admit: 2022-11-05 | Payer: PRIVATE HEALTH INSURANCE | Attending: Nurse Practitioner | Primary: Nurse Practitioner

## 2022-11-06 ENCOUNTER — Ambulatory Visit: Admit: 2022-11-06 | Discharge: 2022-11-07 | Payer: PRIVATE HEALTH INSURANCE

## 2022-11-11 MED ORDER — FLUCONAZOLE 150 MG TABLET
ORAL_TABLET | Freq: Every day | ORAL | 0 refills | 10 days | Status: CP
Start: 2022-11-11 — End: 2022-11-21

## 2022-11-21 ENCOUNTER — Telehealth
Admit: 2022-11-21 | Discharge: 2022-11-22 | Payer: PRIVATE HEALTH INSURANCE | Attending: Hematology & Oncology | Primary: Hematology & Oncology

## 2022-11-21 DIAGNOSIS — C187 Malignant neoplasm of sigmoid colon: Principal | ICD-10-CM

## 2022-11-21 DIAGNOSIS — C800 Disseminated malignant neoplasm, unspecified: Principal | ICD-10-CM

## 2022-11-23 ENCOUNTER — Ambulatory Visit: Admit: 2022-11-23 | Discharge: 2022-11-24 | Payer: PRIVATE HEALTH INSURANCE

## 2022-12-03 ENCOUNTER — Ambulatory Visit: Admit: 2022-12-03 | Discharge: 2022-12-03 | Payer: PRIVATE HEALTH INSURANCE

## 2022-12-07 ENCOUNTER — Ambulatory Visit: Admit: 2022-12-07 | Discharge: 2022-12-08 | Payer: PRIVATE HEALTH INSURANCE

## 2022-12-12 DIAGNOSIS — C800 Disseminated malignant neoplasm, unspecified: Principal | ICD-10-CM

## 2022-12-12 DIAGNOSIS — Z79899 Other long term (current) drug therapy: Principal | ICD-10-CM

## 2022-12-12 DIAGNOSIS — C187 Malignant neoplasm of sigmoid colon: Principal | ICD-10-CM

## 2022-12-20 ENCOUNTER — Ambulatory Visit
Admit: 2022-12-20 | Discharge: 2022-12-21 | Disposition: A | Discharge status: Home / Self Care | Payer: PRIVATE HEALTH INSURANCE

## 2022-12-20 ENCOUNTER — Emergency Department
Admit: 2022-12-20 | Discharge: 2022-12-21 | Disposition: A | Discharge status: Home / Self Care | Payer: PRIVATE HEALTH INSURANCE

## 2022-12-20 DIAGNOSIS — D649 Anemia, unspecified: Principal | ICD-10-CM

## 2022-12-20 DIAGNOSIS — R319 Hematuria, unspecified: Principal | ICD-10-CM

## 2022-12-20 DIAGNOSIS — N390 Urinary tract infection, site not specified: Principal | ICD-10-CM

## 2022-12-20 MED ORDER — DOXYCYCLINE HYCLATE 100 MG CAPSULE
ORAL_CAPSULE | Freq: Two times a day (BID) | ORAL | 0 refills | 10 days | Status: CP
Start: 2022-12-20 — End: 2022-12-30

## 2022-12-21 ENCOUNTER — Ambulatory Visit: Admit: 2022-12-21 | Discharge: 2022-12-22 | Payer: MEDICARE

## 2022-12-23 ENCOUNTER — Ambulatory Visit: Admit: 2022-12-23 | Discharge: 2022-12-24 | Payer: PRIVATE HEALTH INSURANCE

## 2022-12-27 ENCOUNTER — Ambulatory Visit
Admit: 2022-12-27 | Discharge: 2022-12-28 | Payer: MEDICARE | Attending: Hematology & Oncology | Primary: Hematology & Oncology

## 2022-12-27 DIAGNOSIS — Z79899 Other long term (current) drug therapy: Principal | ICD-10-CM

## 2022-12-27 DIAGNOSIS — C800 Disseminated malignant neoplasm, unspecified: Principal | ICD-10-CM

## 2022-12-27 DIAGNOSIS — C187 Malignant neoplasm of sigmoid colon: Principal | ICD-10-CM

## 2022-12-27 MED ORDER — CHOLESTYRAMINE (WITH SUGAR) 4 GRAM POWDER FOR SUSP IN A PACKET
Freq: Two times a day (BID) | ORAL | 11 refills | 30 days | Status: CP
Start: 2022-12-27 — End: 2023-12-27

## 2023-01-04 ENCOUNTER — Ambulatory Visit: Admit: 2023-01-04 | Discharge: 2023-01-05 | Payer: MEDICARE

## 2023-01-06 DIAGNOSIS — C187 Malignant neoplasm of sigmoid colon: Principal | ICD-10-CM

## 2023-01-06 DIAGNOSIS — Z79899 Other long term (current) drug therapy: Principal | ICD-10-CM

## 2023-01-06 DIAGNOSIS — C800 Disseminated malignant neoplasm, unspecified: Principal | ICD-10-CM

## 2023-01-07 MED ORDER — FLUCONAZOLE 100 MG TABLET
ORAL_TABLET | Freq: Every day | ORAL | 0 refills | 10 days | Status: CP
Start: 2023-01-07 — End: 2023-01-17

## 2023-01-13 ENCOUNTER — Encounter
Admit: 2023-01-13 | Discharge: 2023-01-13 | Payer: MEDICARE | Attending: Student in an Organized Health Care Education/Training Program | Primary: Student in an Organized Health Care Education/Training Program

## 2023-01-13 ENCOUNTER — Ambulatory Visit: Admit: 2023-01-13 | Discharge: 2023-01-13 | Payer: MEDICARE

## 2023-01-13 MED ORDER — TAMSULOSIN 0.4 MG CAPSULE
ORAL_CAPSULE | Freq: Every day | ORAL | 3 refills | 90 days | Status: CP
Start: 2023-01-13 — End: 2024-01-13

## 2023-01-14 ENCOUNTER — Ambulatory Visit: Admit: 2023-01-14 | Discharge: 2023-01-15 | Payer: MEDICARE

## 2023-01-14 ENCOUNTER — Encounter: Admit: 2023-01-14 | Discharge: 2023-01-15 | Payer: MEDICARE

## 2023-01-15 MED ORDER — FLUCONAZOLE 100 MG TABLET
ORAL_TABLET | Freq: Every day | ORAL | 0 refills | 12 days | Status: CP
Start: 2023-01-15 — End: 2023-01-27
  Filled 2023-01-15: qty 18, 12d supply, fill #0

## 2023-01-15 MED ORDER — LEVOFLOXACIN 750 MG TABLET
ORAL_TABLET | ORAL | 0 refills | 12 days | Status: CP
Start: 2023-01-15 — End: 2023-01-15

## 2023-01-15 MED ORDER — SOLIFENACIN 5 MG TABLET
ORAL_TABLET | Freq: Every day | ORAL | 2 refills | 30 days | Status: CP
Start: 2023-01-15 — End: 2023-04-15
  Filled 2023-01-15: qty 30, 30d supply, fill #0

## 2023-01-15 MED ORDER — LEVOFLOXACIN 500 MG TABLET
ORAL_TABLET | ORAL | 0 refills | 12 days | Status: CP
Start: 2023-01-15 — End: 2023-01-15
  Filled 2023-01-15: qty 12, 12d supply, fill #0

## 2023-01-16 DIAGNOSIS — C800 Disseminated malignant neoplasm, unspecified: Principal | ICD-10-CM

## 2023-01-16 DIAGNOSIS — C187 Malignant neoplasm of sigmoid colon: Principal | ICD-10-CM

## 2023-01-16 DIAGNOSIS — Z79899 Other long term (current) drug therapy: Principal | ICD-10-CM

## 2023-01-21 DIAGNOSIS — C187 Malignant neoplasm of sigmoid colon: Principal | ICD-10-CM

## 2023-01-21 DIAGNOSIS — C800 Disseminated malignant neoplasm, unspecified: Principal | ICD-10-CM

## 2023-01-21 DIAGNOSIS — Z79899 Other long term (current) drug therapy: Principal | ICD-10-CM

## 2023-01-22 ENCOUNTER — Institutional Professional Consult (permissible substitution): Admit: 2023-01-22 | Discharge: 2023-01-22 | Payer: MEDICARE

## 2023-01-22 DIAGNOSIS — Z79899 Other long term (current) drug therapy: Principal | ICD-10-CM

## 2023-01-22 DIAGNOSIS — C800 Disseminated malignant neoplasm, unspecified: Principal | ICD-10-CM

## 2023-01-22 DIAGNOSIS — C187 Malignant neoplasm of sigmoid colon: Principal | ICD-10-CM

## 2023-01-30 ENCOUNTER — Telehealth
Admit: 2023-01-30 | Discharge: 2023-01-31 | Payer: MEDICARE | Attending: Hematology & Oncology | Primary: Hematology & Oncology

## 2023-01-30 DIAGNOSIS — C187 Malignant neoplasm of sigmoid colon: Principal | ICD-10-CM

## 2023-02-01 ENCOUNTER — Ambulatory Visit: Admit: 2023-02-01 | Discharge: 2023-02-01 | Payer: MEDICARE

## 2023-02-15 ENCOUNTER — Ambulatory Visit: Admit: 2023-02-15 | Discharge: 2023-02-16 | Payer: MEDICARE

## 2023-02-26 ENCOUNTER — Ambulatory Visit: Admit: 2023-02-26 | Discharge: 2023-02-27 | Payer: PRIVATE HEALTH INSURANCE

## 2023-02-27 ENCOUNTER — Ambulatory Visit
Admit: 2023-02-27 | Discharge: 2023-02-28 | Payer: PRIVATE HEALTH INSURANCE | Attending: Hematology & Oncology | Primary: Hematology & Oncology

## 2023-02-27 DIAGNOSIS — Z79899 Other long term (current) drug therapy: Principal | ICD-10-CM

## 2023-02-27 DIAGNOSIS — C800 Disseminated malignant neoplasm, unspecified: Principal | ICD-10-CM

## 2023-02-27 DIAGNOSIS — C187 Malignant neoplasm of sigmoid colon: Principal | ICD-10-CM

## 2023-03-01 ENCOUNTER — Ambulatory Visit: Admit: 2023-03-01 | Discharge: 2023-03-02 | Payer: PRIVATE HEALTH INSURANCE

## 2023-03-15 ENCOUNTER — Ambulatory Visit: Admit: 2023-03-15 | Discharge: 2023-03-16 | Payer: PRIVATE HEALTH INSURANCE

## 2023-03-28 ENCOUNTER — Telehealth
Admit: 2023-03-28 | Discharge: 2023-03-29 | Payer: PRIVATE HEALTH INSURANCE | Attending: Hematology & Oncology | Primary: Hematology & Oncology

## 2023-03-28 DIAGNOSIS — C187 Malignant neoplasm of sigmoid colon: Principal | ICD-10-CM

## 2023-03-28 DIAGNOSIS — Z79899 Other long term (current) drug therapy: Principal | ICD-10-CM

## 2023-03-28 DIAGNOSIS — C800 Disseminated malignant neoplasm, unspecified: Principal | ICD-10-CM

## 2023-03-29 ENCOUNTER — Ambulatory Visit: Admit: 2023-03-29 | Discharge: 2023-03-30 | Payer: PRIVATE HEALTH INSURANCE

## 2023-04-02 DIAGNOSIS — N135 Crossing vessel and stricture of ureter without hydronephrosis: Principal | ICD-10-CM

## 2023-04-04 DIAGNOSIS — Z79899 Other long term (current) drug therapy: Principal | ICD-10-CM

## 2023-04-04 DIAGNOSIS — C187 Malignant neoplasm of sigmoid colon: Principal | ICD-10-CM

## 2023-04-04 DIAGNOSIS — C800 Disseminated malignant neoplasm, unspecified: Principal | ICD-10-CM

## 2023-04-05 ENCOUNTER — Ambulatory Visit: Admit: 2023-04-05 | Discharge: 2023-04-06 | Payer: PRIVATE HEALTH INSURANCE

## 2023-04-15 DIAGNOSIS — Z792 Long term (current) use of antibiotics: Principal | ICD-10-CM

## 2023-04-19 ENCOUNTER — Ambulatory Visit: Admit: 2023-04-19 | Discharge: 2023-04-20 | Payer: PRIVATE HEALTH INSURANCE

## 2023-04-21 DIAGNOSIS — C800 Disseminated malignant neoplasm, unspecified: Principal | ICD-10-CM

## 2023-04-21 DIAGNOSIS — Z79899 Other long term (current) drug therapy: Principal | ICD-10-CM

## 2023-04-21 DIAGNOSIS — C187 Malignant neoplasm of sigmoid colon: Principal | ICD-10-CM

## 2023-04-22 ENCOUNTER — Encounter: Admit: 2023-04-22 | Discharge: 2023-04-22 | Payer: PRIVATE HEALTH INSURANCE

## 2023-04-22 ENCOUNTER — Ambulatory Visit: Admit: 2023-04-22 | Discharge: 2023-04-22 | Payer: BLUE CROSS/BLUE SHIELD

## 2023-04-22 ENCOUNTER — Ambulatory Visit: Admit: 2023-04-22 | Discharge: 2023-04-22 | Payer: PRIVATE HEALTH INSURANCE

## 2023-04-22 ENCOUNTER — Encounter: Admit: 2023-04-22 | Discharge: 2023-04-22 | Payer: BLUE CROSS/BLUE SHIELD

## 2023-04-22 DIAGNOSIS — A499 Bacterial infection, unspecified: Principal | ICD-10-CM

## 2023-04-22 DIAGNOSIS — N135 Crossing vessel and stricture of ureter without hydronephrosis: Principal | ICD-10-CM

## 2023-04-22 DIAGNOSIS — N390 Urinary tract infection, site not specified: Principal | ICD-10-CM

## 2023-04-22 MED ORDER — PHENAZOPYRIDINE 200 MG TABLET
ORAL_TABLET | Freq: Three times a day (TID) | ORAL | 0 refills | 2 days | Status: CP | PRN
Start: 2023-04-22 — End: 2023-04-24
  Filled 2023-04-22: qty 6, 2d supply, fill #0

## 2023-04-22 MED ORDER — FLUCONAZOLE 150 MG TABLET
ORAL_TABLET | Freq: Every day | ORAL | 0 refills | 7 days | Status: CP
Start: 2023-04-22 — End: 2023-04-22

## 2023-04-22 MED ORDER — SOLIFENACIN 5 MG TABLET
ORAL_TABLET | Freq: Every day | ORAL | 0 refills | 14 days | Status: CP
Start: 2023-04-22 — End: 2023-05-06
  Filled 2023-04-22: qty 14, 14d supply, fill #0

## 2023-04-22 MED ORDER — CIPROFLOXACIN 500 MG TABLET
ORAL_TABLET | Freq: Two times a day (BID) | ORAL | 0 refills | 7 days | Status: CP
Start: 2023-04-22 — End: 2023-04-29

## 2023-04-25 ENCOUNTER — Institutional Professional Consult (permissible substitution): Admit: 2023-04-25 | Discharge: 2023-04-25 | Payer: BLUE CROSS/BLUE SHIELD

## 2023-04-25 ENCOUNTER — Institutional Professional Consult (permissible substitution): Admit: 2023-04-25 | Discharge: 2023-04-25 | Payer: PRIVATE HEALTH INSURANCE

## 2023-04-25 ENCOUNTER — Ambulatory Visit: Admit: 2023-04-25 | Discharge: 2023-04-25 | Payer: BLUE CROSS/BLUE SHIELD

## 2023-04-25 DIAGNOSIS — C187 Malignant neoplasm of sigmoid colon: Principal | ICD-10-CM

## 2023-04-25 DIAGNOSIS — C800 Disseminated malignant neoplasm, unspecified: Principal | ICD-10-CM

## 2023-04-25 DIAGNOSIS — Z79899 Other long term (current) drug therapy: Principal | ICD-10-CM

## 2023-04-29 ENCOUNTER — Ambulatory Visit: Admit: 2023-04-29 | Discharge: 2023-04-30 | Payer: PRIVATE HEALTH INSURANCE

## 2023-05-01 ENCOUNTER — Telehealth
Admit: 2023-05-01 | Discharge: 2023-05-02 | Payer: PRIVATE HEALTH INSURANCE | Attending: Hematology & Oncology | Primary: Hematology & Oncology

## 2023-05-01 DIAGNOSIS — C800 Disseminated malignant neoplasm, unspecified: Principal | ICD-10-CM

## 2023-05-01 DIAGNOSIS — C187 Malignant neoplasm of sigmoid colon: Principal | ICD-10-CM

## 2023-05-07 ENCOUNTER — Institutional Professional Consult (permissible substitution): Admit: 2023-05-07 | Discharge: 2023-05-08 | Payer: PRIVATE HEALTH INSURANCE

## 2023-05-07 DIAGNOSIS — C187 Malignant neoplasm of sigmoid colon: Principal | ICD-10-CM

## 2023-05-12 DIAGNOSIS — C187 Malignant neoplasm of sigmoid colon: Principal | ICD-10-CM

## 2023-05-12 DIAGNOSIS — C800 Disseminated malignant neoplasm, unspecified: Principal | ICD-10-CM

## 2023-05-12 DIAGNOSIS — Z79899 Other long term (current) drug therapy: Principal | ICD-10-CM

## 2023-05-22 DIAGNOSIS — C800 Disseminated malignant neoplasm, unspecified: Principal | ICD-10-CM

## 2023-05-22 DIAGNOSIS — C187 Malignant neoplasm of sigmoid colon: Principal | ICD-10-CM

## 2023-05-22 DIAGNOSIS — Z79899 Other long term (current) drug therapy: Principal | ICD-10-CM

## 2023-05-25 ENCOUNTER — Ambulatory Visit: Admit: 2023-05-25 | Discharge: 2023-05-28 | Payer: PRIVATE HEALTH INSURANCE

## 2023-05-25 ENCOUNTER — Ambulatory Visit: Admit: 2023-05-25 | Discharge: 2023-05-26 | Payer: PRIVATE HEALTH INSURANCE

## 2023-05-25 ENCOUNTER — Encounter
Admit: 2023-05-25 | Discharge: 2023-05-28 | Payer: PRIVATE HEALTH INSURANCE | Attending: Emergency Medicine | Primary: Emergency Medicine

## 2023-05-27 DIAGNOSIS — C187 Malignant neoplasm of sigmoid colon: Principal | ICD-10-CM

## 2023-05-27 DIAGNOSIS — Z79899 Other long term (current) drug therapy: Principal | ICD-10-CM

## 2023-05-27 DIAGNOSIS — C800 Disseminated malignant neoplasm, unspecified: Principal | ICD-10-CM

## 2023-05-28 DIAGNOSIS — N135 Crossing vessel and stricture of ureter without hydronephrosis: Principal | ICD-10-CM

## 2023-05-28 MED ORDER — FLUCONAZOLE 200 MG TABLET
ORAL_TABLET | Freq: Every day | ORAL | 0 refills | 7.00000 days | Status: CP
Start: 2023-05-28 — End: 2023-06-04

## 2023-06-02 ENCOUNTER — Encounter: Admit: 2023-06-02 | Discharge: 2023-06-02 | Payer: PRIVATE HEALTH INSURANCE

## 2023-06-02 ENCOUNTER — Ambulatory Visit: Admit: 2023-06-02 | Discharge: 2023-06-02 | Payer: PRIVATE HEALTH INSURANCE

## 2023-06-02 DIAGNOSIS — N135 Crossing vessel and stricture of ureter without hydronephrosis: Principal | ICD-10-CM

## 2023-06-02 DIAGNOSIS — C800 Disseminated malignant neoplasm, unspecified: Principal | ICD-10-CM

## 2023-06-02 DIAGNOSIS — C187 Malignant neoplasm of sigmoid colon: Principal | ICD-10-CM

## 2023-06-02 DIAGNOSIS — Z79899 Other long term (current) drug therapy: Principal | ICD-10-CM

## 2023-06-02 DIAGNOSIS — Z2989 Need for prophylaxis against urinary tract infection: Principal | ICD-10-CM

## 2023-06-02 MED ORDER — CIPROFLOXACIN 500 MG TABLET
ORAL_TABLET | Freq: Two times a day (BID) | ORAL | 0 refills | 7 days | Status: CP
Start: 2023-06-02 — End: 2023-06-09

## 2023-06-02 MED ORDER — PHENAZOPYRIDINE 200 MG TABLET
ORAL_TABLET | Freq: Three times a day (TID) | ORAL | 0 refills | 2 days | Status: CP | PRN
Start: 2023-06-02 — End: 2023-06-04

## 2023-06-02 MED ORDER — FLUCONAZOLE 100 MG TABLET
ORAL_TABLET | Freq: Every day | ORAL | 0 refills | 7 days | Status: CP
Start: 2023-06-02 — End: 2023-06-09

## 2023-06-02 MED ORDER — SOLIFENACIN 5 MG TABLET
ORAL_TABLET | Freq: Every day | ORAL | 0 refills | 5 days | Status: CP
Start: 2023-06-02 — End: 2023-06-07

## 2023-06-04 DIAGNOSIS — Z79899 Other long term (current) drug therapy: Principal | ICD-10-CM

## 2023-06-04 DIAGNOSIS — C187 Malignant neoplasm of sigmoid colon: Principal | ICD-10-CM

## 2023-06-04 DIAGNOSIS — C800 Disseminated malignant neoplasm, unspecified: Principal | ICD-10-CM

## 2023-06-05 ENCOUNTER — Ambulatory Visit: Admit: 2023-06-05 | Discharge: 2023-06-06 | Payer: PRIVATE HEALTH INSURANCE

## 2023-06-05 DIAGNOSIS — Z79899 Other long term (current) drug therapy: Principal | ICD-10-CM

## 2023-06-05 DIAGNOSIS — C187 Malignant neoplasm of sigmoid colon: Principal | ICD-10-CM

## 2023-06-05 DIAGNOSIS — D4959 Neoplasm of unspecified behavior of other genitourinary organ: Principal | ICD-10-CM

## 2023-06-05 DIAGNOSIS — C800 Disseminated malignant neoplasm, unspecified: Principal | ICD-10-CM

## 2023-06-07 ENCOUNTER — Ambulatory Visit
Admit: 2023-06-07 | Discharge: 2023-06-10 | Disposition: A | Discharge status: Home / Self Care | Payer: PRIVATE HEALTH INSURANCE | Admitting: Student in an Organized Health Care Education/Training Program

## 2023-06-10 DIAGNOSIS — C187 Malignant neoplasm of sigmoid colon: Principal | ICD-10-CM

## 2023-06-10 DIAGNOSIS — C800 Disseminated malignant neoplasm, unspecified: Principal | ICD-10-CM

## 2023-06-10 DIAGNOSIS — Z79899 Other long term (current) drug therapy: Principal | ICD-10-CM

## 2023-06-10 MED ORDER — PHENAZOPYRIDINE 200 MG TABLET
ORAL_TABLET | Freq: Three times a day (TID) | ORAL | 0 refills | 20.00 days | Status: CN | PRN
Start: 2023-06-10 — End: ?

## 2023-06-10 MED ORDER — SOLIFENACIN 5 MG TABLET
ORAL_TABLET | Freq: Every day | ORAL | 0 refills | 30.00 days | Status: CP
Start: 2023-06-10 — End: ?

## 2023-06-11 DIAGNOSIS — C800 Disseminated malignant neoplasm, unspecified: Principal | ICD-10-CM

## 2023-06-11 DIAGNOSIS — C187 Malignant neoplasm of sigmoid colon: Principal | ICD-10-CM

## 2023-06-11 DIAGNOSIS — Z79899 Other long term (current) drug therapy: Principal | ICD-10-CM

## 2023-06-11 MED ORDER — FLUCONAZOLE 200 MG TABLET
ORAL_TABLET | Freq: Every day | ORAL | 0 refills | 13.00 days | Status: CP
Start: 2023-06-11 — End: 2023-06-24

## 2023-06-12 ENCOUNTER — Telehealth
Admit: 2023-06-12 | Discharge: 2023-06-12 | Payer: PRIVATE HEALTH INSURANCE | Attending: Hematology & Oncology | Primary: Hematology & Oncology

## 2023-06-12 ENCOUNTER — Institutional Professional Consult (permissible substitution): Admit: 2023-06-12 | Discharge: 2023-06-12 | Payer: PRIVATE HEALTH INSURANCE

## 2023-06-12 ENCOUNTER — Ambulatory Visit: Admit: 2023-06-12 | Discharge: 2023-06-12 | Payer: PRIVATE HEALTH INSURANCE

## 2023-06-12 DIAGNOSIS — Z79899 Other long term (current) drug therapy: Principal | ICD-10-CM

## 2023-06-12 DIAGNOSIS — C800 Disseminated malignant neoplasm, unspecified: Principal | ICD-10-CM

## 2023-06-12 DIAGNOSIS — Z2989 Need for prophylaxis against urinary tract infection: Principal | ICD-10-CM

## 2023-06-12 DIAGNOSIS — C187 Malignant neoplasm of sigmoid colon: Principal | ICD-10-CM

## 2023-06-12 MED ORDER — CIPROFLOXACIN 500 MG TABLET
ORAL_TABLET | Freq: Two times a day (BID) | ORAL | 0 refills | 7.00 days | Status: CP
Start: 2023-06-12 — End: 2023-06-19

## 2023-06-12 MED ORDER — MIRTAZAPINE 15 MG TABLET
ORAL_TABLET | Freq: Every evening | ORAL | 11 refills | 90.00 days | Status: CP
Start: 2023-06-12 — End: 2023-07-12

## 2023-06-13 DIAGNOSIS — C800 Disseminated malignant neoplasm, unspecified: Principal | ICD-10-CM

## 2023-06-13 DIAGNOSIS — Z79899 Other long term (current) drug therapy: Principal | ICD-10-CM

## 2023-06-13 DIAGNOSIS — C187 Malignant neoplasm of sigmoid colon: Principal | ICD-10-CM

## 2023-06-16 DIAGNOSIS — C800 Disseminated malignant neoplasm, unspecified: Principal | ICD-10-CM

## 2023-06-16 DIAGNOSIS — C187 Malignant neoplasm of sigmoid colon: Principal | ICD-10-CM

## 2023-06-16 DIAGNOSIS — Z79899 Other long term (current) drug therapy: Principal | ICD-10-CM

## 2023-06-18 ENCOUNTER — Ambulatory Visit: Admit: 2023-06-18 | Payer: PRIVATE HEALTH INSURANCE

## 2023-06-18 ENCOUNTER — Ambulatory Visit
Admit: 2023-06-18 | Discharge: 2023-06-27 | Disposition: A | Discharge status: Home / Self Care | Payer: PRIVATE HEALTH INSURANCE

## 2023-06-18 ENCOUNTER — Ambulatory Visit: Admit: 2023-06-18 | Discharge: 2023-06-27 | Payer: PRIVATE HEALTH INSURANCE

## 2023-06-18 ENCOUNTER — Encounter
Admit: 2023-06-18 | Discharge: 2023-06-27 | Disposition: A | Discharge status: Home / Self Care | Payer: PRIVATE HEALTH INSURANCE

## 2023-06-18 ENCOUNTER — Encounter
Admit: 2023-06-18 | Payer: PRIVATE HEALTH INSURANCE | Attending: Student in an Organized Health Care Education/Training Program | Primary: Student in an Organized Health Care Education/Training Program

## 2023-06-18 DIAGNOSIS — C800 Disseminated malignant neoplasm, unspecified: Principal | ICD-10-CM

## 2023-06-18 DIAGNOSIS — Z79899 Other long term (current) drug therapy: Principal | ICD-10-CM

## 2023-06-18 DIAGNOSIS — C187 Malignant neoplasm of sigmoid colon: Principal | ICD-10-CM

## 2023-06-19 DIAGNOSIS — Z79899 Other long term (current) drug therapy: Principal | ICD-10-CM

## 2023-06-19 DIAGNOSIS — C800 Disseminated malignant neoplasm, unspecified: Principal | ICD-10-CM

## 2023-06-19 DIAGNOSIS — C187 Malignant neoplasm of sigmoid colon: Principal | ICD-10-CM

## 2023-06-20 DIAGNOSIS — Z79899 Other long term (current) drug therapy: Principal | ICD-10-CM

## 2023-06-20 DIAGNOSIS — C187 Malignant neoplasm of sigmoid colon: Principal | ICD-10-CM

## 2023-06-20 DIAGNOSIS — C800 Disseminated malignant neoplasm, unspecified: Principal | ICD-10-CM

## 2023-06-23 DIAGNOSIS — C187 Malignant neoplasm of sigmoid colon: Principal | ICD-10-CM

## 2023-06-23 DIAGNOSIS — C800 Disseminated malignant neoplasm, unspecified: Principal | ICD-10-CM

## 2023-06-23 DIAGNOSIS — Z79899 Other long term (current) drug therapy: Principal | ICD-10-CM

## 2023-06-26 DIAGNOSIS — C187 Malignant neoplasm of sigmoid colon: Principal | ICD-10-CM

## 2023-06-26 DIAGNOSIS — Z79899 Other long term (current) drug therapy: Principal | ICD-10-CM

## 2023-06-26 DIAGNOSIS — C800 Disseminated malignant neoplasm, unspecified: Principal | ICD-10-CM

## 2023-06-27 MED ORDER — MEGESTROL 20 MG TABLET
ORAL_TABLET | Freq: Every day | ORAL | 11 refills | 30.00 days | Status: CP
Start: 2023-06-27 — End: 2024-06-26

## 2023-06-27 MED ORDER — ONDANSETRON 4 MG DISINTEGRATING TABLET
ORAL_TABLET | Freq: Three times a day (TID) | ORAL | 0 refills | 10.00 days | Status: CP | PRN
Start: 2023-06-27 — End: 2023-07-07

## 2023-06-27 MED ORDER — LOPERAMIDE 2 MG CAPSULE
ORAL_CAPSULE | Freq: Four times a day (QID) | ORAL | 0 refills | 8.00 days | Status: CP | PRN
Start: 2023-06-27 — End: 2023-07-27

## 2023-06-27 MED ORDER — POTASSIUM, SODIUM PHOSPHATES 280 MG-160 MG-250 MG ORAL POWDER PACKET
PACK | Freq: Two times a day (BID) | ORAL | 0 refills | 30.00 days | Status: CP
Start: 2023-06-27 — End: ?

## 2023-06-29 MED ORDER — FERROUS SULFATE 325 MG (65 MG IRON) TABLET
ORAL_TABLET | ORAL | 11 refills | 30.00 days | Status: CP
Start: 2023-06-29 — End: 2024-06-28

## 2023-06-30 DIAGNOSIS — Z79899 Other long term (current) drug therapy: Principal | ICD-10-CM

## 2023-06-30 DIAGNOSIS — C800 Disseminated malignant neoplasm, unspecified: Principal | ICD-10-CM

## 2023-06-30 DIAGNOSIS — C187 Malignant neoplasm of sigmoid colon: Principal | ICD-10-CM

## 2023-07-03 ENCOUNTER — Ambulatory Visit
Admit: 2023-07-03 | Discharge: 2023-07-04 | Payer: PRIVATE HEALTH INSURANCE | Attending: Hematology & Oncology | Primary: Hematology & Oncology

## 2023-07-03 ENCOUNTER — Ambulatory Visit: Admit: 2023-07-03 | Discharge: 2023-07-04 | Payer: PRIVATE HEALTH INSURANCE

## 2023-07-03 DIAGNOSIS — Z79899 Other long term (current) drug therapy: Principal | ICD-10-CM

## 2023-07-03 DIAGNOSIS — E876 Hypokalemia: Principal | ICD-10-CM

## 2023-07-03 DIAGNOSIS — C187 Malignant neoplasm of sigmoid colon: Principal | ICD-10-CM

## 2023-07-03 DIAGNOSIS — C800 Disseminated malignant neoplasm, unspecified: Principal | ICD-10-CM

## 2023-07-07 DIAGNOSIS — Z79899 Other long term (current) drug therapy: Principal | ICD-10-CM

## 2023-07-07 DIAGNOSIS — C187 Malignant neoplasm of sigmoid colon: Principal | ICD-10-CM

## 2023-07-07 DIAGNOSIS — C800 Disseminated malignant neoplasm, unspecified: Principal | ICD-10-CM

## 2023-07-09 ENCOUNTER — Encounter: Admit: 2023-07-09 | Discharge: 2023-07-10 | Payer: PRIVATE HEALTH INSURANCE

## 2023-07-09 DIAGNOSIS — R188 Other ascites: Principal | ICD-10-CM

## 2023-07-09 DIAGNOSIS — C800 Disseminated malignant neoplasm, unspecified: Principal | ICD-10-CM

## 2023-07-09 DIAGNOSIS — Z79899 Other long term (current) drug therapy: Principal | ICD-10-CM

## 2023-07-09 DIAGNOSIS — C187 Malignant neoplasm of sigmoid colon: Principal | ICD-10-CM

## 2023-07-11 ENCOUNTER — Inpatient Hospital Stay: Admit: 2023-07-11 | Discharge: 2023-07-12 | Payer: PRIVATE HEALTH INSURANCE

## 2023-07-11 ENCOUNTER — Ambulatory Visit: Admit: 2023-07-11 | Discharge: 2023-07-12 | Payer: PRIVATE HEALTH INSURANCE

## 2023-07-11 ENCOUNTER — Encounter
Admit: 2023-07-11 | Discharge: 2023-07-12 | Payer: PRIVATE HEALTH INSURANCE | Attending: Hematology & Oncology | Primary: Hematology & Oncology

## 2023-07-11 DIAGNOSIS — C800 Disseminated malignant neoplasm, unspecified: Principal | ICD-10-CM

## 2023-07-11 DIAGNOSIS — Z79899 Other long term (current) drug therapy: Principal | ICD-10-CM

## 2023-07-11 DIAGNOSIS — C187 Malignant neoplasm of sigmoid colon: Principal | ICD-10-CM

## 2023-07-15 ENCOUNTER — Ambulatory Visit
Admit: 2023-07-15 | Discharge: 2023-07-16 | Payer: PRIVATE HEALTH INSURANCE | Attending: Acute Care | Primary: Acute Care

## 2023-07-15 DIAGNOSIS — K651 Peritoneal abscess: Principal | ICD-10-CM

## 2023-07-16 ENCOUNTER — Ambulatory Visit: Admit: 2023-07-16 | Discharge: 2023-07-17 | Payer: PRIVATE HEALTH INSURANCE

## 2023-07-16 DIAGNOSIS — N139 Obstructive and reflux uropathy, unspecified: Principal | ICD-10-CM

## 2023-07-16 DIAGNOSIS — E876 Hypokalemia: Principal | ICD-10-CM

## 2023-07-16 DIAGNOSIS — K529 Noninfective gastroenteritis and colitis, unspecified: Principal | ICD-10-CM

## 2023-07-16 DIAGNOSIS — C189 Malignant neoplasm of colon, unspecified: Principal | ICD-10-CM

## 2023-07-16 DIAGNOSIS — D509 Iron deficiency anemia, unspecified: Principal | ICD-10-CM

## 2023-07-16 DIAGNOSIS — N390 Urinary tract infection, site not specified: Principal | ICD-10-CM

## 2023-07-16 MED ORDER — CITALOPRAM 10 MG TABLET
ORAL_TABLET | Freq: Every day | ORAL | 11 refills | 30.00 days | Status: CP
Start: 2023-07-16 — End: 2024-07-15

## 2023-07-24 DIAGNOSIS — R188 Other ascites: Principal | ICD-10-CM

## 2023-07-30 DIAGNOSIS — Z79899 Other long term (current) drug therapy: Principal | ICD-10-CM

## 2023-07-30 DIAGNOSIS — C187 Malignant neoplasm of sigmoid colon: Principal | ICD-10-CM

## 2023-07-30 DIAGNOSIS — C800 Disseminated malignant neoplasm, unspecified: Principal | ICD-10-CM

## 2023-07-31 DIAGNOSIS — Z79899 Other long term (current) drug therapy: Principal | ICD-10-CM

## 2023-07-31 DIAGNOSIS — C800 Disseminated malignant neoplasm, unspecified: Principal | ICD-10-CM

## 2023-07-31 DIAGNOSIS — C187 Malignant neoplasm of sigmoid colon: Principal | ICD-10-CM

## 2023-08-01 ENCOUNTER — Ambulatory Visit: Admit: 2023-08-01 | Payer: PRIVATE HEALTH INSURANCE

## 2023-08-01 ENCOUNTER — Encounter
Admit: 2023-08-01 | Discharge: 2023-08-01 | Payer: PRIVATE HEALTH INSURANCE | Attending: Hematology & Oncology | Primary: Hematology & Oncology

## 2023-08-01 ENCOUNTER — Inpatient Hospital Stay
Admit: 2023-08-01 | Discharge: 2023-08-08 | Disposition: A | Discharge status: Home / Self Care | Payer: PRIVATE HEALTH INSURANCE | Admitting: Student in an Organized Health Care Education/Training Program

## 2023-08-01 ENCOUNTER — Encounter
Admit: 2023-08-01 | Discharge: 2023-08-08 | Disposition: A | Discharge status: Home / Self Care | Payer: PRIVATE HEALTH INSURANCE | Attending: Student in an Organized Health Care Education/Training Program | Admitting: Student in an Organized Health Care Education/Training Program

## 2023-08-01 ENCOUNTER — Encounter: Admit: 2023-08-01 | Discharge: 2023-08-08 | Payer: PRIVATE HEALTH INSURANCE

## 2023-08-01 ENCOUNTER — Ambulatory Visit: Admit: 2023-08-01 | Discharge: 2023-08-08 | Payer: PRIVATE HEALTH INSURANCE

## 2023-08-01 ENCOUNTER — Ambulatory Visit: Admit: 2023-08-01 | Discharge: 2023-08-01 | Payer: PRIVATE HEALTH INSURANCE

## 2023-08-01 ENCOUNTER — Inpatient Hospital Stay: Admit: 2023-08-01 | Discharge: 2023-08-01 | Payer: PRIVATE HEALTH INSURANCE

## 2023-08-01 DIAGNOSIS — C800 Disseminated malignant neoplasm, unspecified: Principal | ICD-10-CM

## 2023-08-01 DIAGNOSIS — C187 Malignant neoplasm of sigmoid colon: Principal | ICD-10-CM

## 2023-08-01 DIAGNOSIS — Z79899 Other long term (current) drug therapy: Principal | ICD-10-CM

## 2023-08-01 DIAGNOSIS — R188 Other ascites: Principal | ICD-10-CM

## 2023-08-03 DIAGNOSIS — C187 Malignant neoplasm of sigmoid colon: Principal | ICD-10-CM

## 2023-08-03 DIAGNOSIS — Z79899 Other long term (current) drug therapy: Principal | ICD-10-CM

## 2023-08-03 DIAGNOSIS — C800 Disseminated malignant neoplasm, unspecified: Principal | ICD-10-CM

## 2023-08-04 DIAGNOSIS — C800 Disseminated malignant neoplasm, unspecified: Principal | ICD-10-CM

## 2023-08-04 DIAGNOSIS — C187 Malignant neoplasm of sigmoid colon: Principal | ICD-10-CM

## 2023-08-04 DIAGNOSIS — Z79899 Other long term (current) drug therapy: Principal | ICD-10-CM

## 2023-08-05 DIAGNOSIS — C800 Disseminated malignant neoplasm, unspecified: Principal | ICD-10-CM

## 2023-08-05 DIAGNOSIS — Z79899 Other long term (current) drug therapy: Principal | ICD-10-CM

## 2023-08-05 DIAGNOSIS — C187 Malignant neoplasm of sigmoid colon: Principal | ICD-10-CM

## 2023-08-08 DIAGNOSIS — Z79899 Other long term (current) drug therapy: Principal | ICD-10-CM

## 2023-08-08 DIAGNOSIS — C187 Malignant neoplasm of sigmoid colon: Principal | ICD-10-CM

## 2023-08-08 DIAGNOSIS — C800 Disseminated malignant neoplasm, unspecified: Principal | ICD-10-CM

## 2023-08-08 MED ORDER — MIRTAZAPINE 15 MG TABLET
ORAL_TABLET | Freq: Every evening | ORAL | 11 refills | 90.00 days | Status: CP | PRN
Start: 2023-08-08 — End: ?

## 2023-08-11 DIAGNOSIS — C187 Malignant neoplasm of sigmoid colon: Principal | ICD-10-CM

## 2023-08-11 DIAGNOSIS — C188 Malignant neoplasm of overlapping sites of colon: Principal | ICD-10-CM

## 2023-08-11 DIAGNOSIS — Z79899 Other long term (current) drug therapy: Principal | ICD-10-CM

## 2023-08-11 DIAGNOSIS — C800 Disseminated malignant neoplasm, unspecified: Principal | ICD-10-CM

## 2023-08-12 DIAGNOSIS — C188 Malignant neoplasm of overlapping sites of colon: Principal | ICD-10-CM

## 2023-08-12 DIAGNOSIS — C800 Disseminated malignant neoplasm, unspecified: Principal | ICD-10-CM

## 2023-08-12 DIAGNOSIS — Z79899 Other long term (current) drug therapy: Principal | ICD-10-CM

## 2023-08-12 DIAGNOSIS — C187 Malignant neoplasm of sigmoid colon: Principal | ICD-10-CM

## 2023-08-13 DIAGNOSIS — C188 Malignant neoplasm of overlapping sites of colon: Principal | ICD-10-CM

## 2023-08-13 DIAGNOSIS — Z79899 Other long term (current) drug therapy: Principal | ICD-10-CM

## 2023-08-13 DIAGNOSIS — C187 Malignant neoplasm of sigmoid colon: Principal | ICD-10-CM

## 2023-08-13 DIAGNOSIS — C800 Disseminated malignant neoplasm, unspecified: Principal | ICD-10-CM

## 2023-08-14 ENCOUNTER — Encounter
Admit: 2023-08-14 | Discharge: 2023-08-15 | Payer: PRIVATE HEALTH INSURANCE | Attending: Hematology & Oncology | Primary: Hematology & Oncology

## 2023-08-14 DIAGNOSIS — C188 Malignant neoplasm of overlapping sites of colon: Principal | ICD-10-CM

## 2023-08-14 DIAGNOSIS — C187 Malignant neoplasm of sigmoid colon: Principal | ICD-10-CM

## 2023-08-14 DIAGNOSIS — Z79899 Other long term (current) drug therapy: Principal | ICD-10-CM

## 2023-08-14 DIAGNOSIS — K632 Fistula of intestine: Principal | ICD-10-CM

## 2023-08-14 DIAGNOSIS — C800 Disseminated malignant neoplasm, unspecified: Principal | ICD-10-CM

## 2023-08-15 DIAGNOSIS — C187 Malignant neoplasm of sigmoid colon: Principal | ICD-10-CM

## 2023-08-15 DIAGNOSIS — C188 Malignant neoplasm of overlapping sites of colon: Principal | ICD-10-CM

## 2023-08-15 DIAGNOSIS — C800 Disseminated malignant neoplasm, unspecified: Principal | ICD-10-CM

## 2023-08-15 DIAGNOSIS — Z79899 Other long term (current) drug therapy: Principal | ICD-10-CM

## 2023-08-18 DIAGNOSIS — C187 Malignant neoplasm of sigmoid colon: Principal | ICD-10-CM

## 2023-08-18 DIAGNOSIS — C800 Disseminated malignant neoplasm, unspecified: Principal | ICD-10-CM

## 2023-08-18 DIAGNOSIS — C188 Malignant neoplasm of overlapping sites of colon: Principal | ICD-10-CM

## 2023-08-18 DIAGNOSIS — Z79899 Other long term (current) drug therapy: Principal | ICD-10-CM

## 2023-08-20 ENCOUNTER — Ambulatory Visit: Admit: 2023-08-20 | Discharge: 2023-08-21 | Payer: PRIVATE HEALTH INSURANCE

## 2023-08-20 ENCOUNTER — Encounter
Admit: 2023-08-20 | Discharge: 2023-08-21 | Payer: PRIVATE HEALTH INSURANCE | Attending: Hematology & Oncology | Primary: Hematology & Oncology

## 2023-08-20 DIAGNOSIS — C188 Malignant neoplasm of overlapping sites of colon: Principal | ICD-10-CM

## 2023-08-20 DIAGNOSIS — Z79899 Other long term (current) drug therapy: Principal | ICD-10-CM

## 2023-08-20 DIAGNOSIS — C800 Disseminated malignant neoplasm, unspecified: Principal | ICD-10-CM

## 2023-08-20 DIAGNOSIS — D500 Iron deficiency anemia secondary to blood loss (chronic): Principal | ICD-10-CM

## 2023-08-20 DIAGNOSIS — C187 Malignant neoplasm of sigmoid colon: Principal | ICD-10-CM

## 2023-08-21 ENCOUNTER — Inpatient Hospital Stay: Admit: 2023-08-21 | Discharge: 2023-08-22 | Payer: PRIVATE HEALTH INSURANCE

## 2023-08-21 ENCOUNTER — Ambulatory Visit: Admit: 2023-08-21 | Discharge: 2023-08-22 | Payer: PRIVATE HEALTH INSURANCE

## 2023-08-21 ENCOUNTER — Encounter
Admit: 2023-08-21 | Discharge: 2023-08-22 | Payer: PRIVATE HEALTH INSURANCE | Attending: Hematology & Oncology | Primary: Hematology & Oncology

## 2023-08-21 DIAGNOSIS — C188 Malignant neoplasm of overlapping sites of colon: Principal | ICD-10-CM

## 2023-08-21 DIAGNOSIS — Z79899 Other long term (current) drug therapy: Principal | ICD-10-CM

## 2023-08-21 DIAGNOSIS — C187 Malignant neoplasm of sigmoid colon: Principal | ICD-10-CM

## 2023-08-21 DIAGNOSIS — C800 Disseminated malignant neoplasm, unspecified: Principal | ICD-10-CM

## 2023-08-22 DIAGNOSIS — Z79899 Other long term (current) drug therapy: Principal | ICD-10-CM

## 2023-08-22 DIAGNOSIS — C187 Malignant neoplasm of sigmoid colon: Principal | ICD-10-CM

## 2023-08-22 DIAGNOSIS — C800 Disseminated malignant neoplasm, unspecified: Principal | ICD-10-CM

## 2023-08-22 DIAGNOSIS — C188 Malignant neoplasm of overlapping sites of colon: Principal | ICD-10-CM

## 2023-08-23 ENCOUNTER — Inpatient Hospital Stay: Admit: 2023-08-23 | Discharge: 2023-08-24 | Payer: PRIVATE HEALTH INSURANCE

## 2023-08-25 ENCOUNTER — Ambulatory Visit: Admit: 2023-08-25 | Discharge: 2023-08-26 | Payer: PRIVATE HEALTH INSURANCE

## 2023-08-25 ENCOUNTER — Encounter
Admit: 2023-08-25 | Discharge: 2023-08-25 | Payer: PRIVATE HEALTH INSURANCE | Attending: Specialist | Primary: Specialist

## 2023-08-25 ENCOUNTER — Inpatient Hospital Stay: Admit: 2023-08-25 | Discharge: 2023-08-25 | Payer: BLUE CROSS/BLUE SHIELD

## 2023-08-25 ENCOUNTER — Ambulatory Visit: Admit: 2023-08-25 | Discharge: 2023-08-25 | Payer: PRIVATE HEALTH INSURANCE

## 2023-08-25 DIAGNOSIS — R2242 Localized swelling, mass and lump, left lower limb: Principal | ICD-10-CM

## 2023-08-25 MED ORDER — FLUCONAZOLE 100 MG TABLET
ORAL_TABLET | Freq: Every day | ORAL | 0 refills | 5.00 days | Status: CP
Start: 2023-08-25 — End: 2023-08-30

## 2023-08-25 MED ORDER — CIPROFLOXACIN 500 MG TABLET
ORAL_TABLET | Freq: Two times a day (BID) | ORAL | 0 refills | 5.00 days | Status: CP
Start: 2023-08-25 — End: 2023-08-30

## 2023-08-26 DIAGNOSIS — C187 Malignant neoplasm of sigmoid colon: Principal | ICD-10-CM

## 2023-08-26 DIAGNOSIS — C800 Disseminated malignant neoplasm, unspecified: Principal | ICD-10-CM

## 2023-08-26 DIAGNOSIS — C188 Malignant neoplasm of overlapping sites of colon: Principal | ICD-10-CM

## 2023-08-26 DIAGNOSIS — Z79899 Other long term (current) drug therapy: Principal | ICD-10-CM

## 2023-08-27 DIAGNOSIS — R7881 Bacteremia: Principal | ICD-10-CM

## 2023-08-27 MED ORDER — DOXYCYCLINE HYCLATE 100 MG TABLET
ORAL_TABLET | Freq: Two times a day (BID) | ORAL | 2 refills | 90.00 days | Status: CP
Start: 2023-08-27 — End: 2024-05-23

## 2023-08-28 ENCOUNTER — Inpatient Hospital Stay: Admit: 2023-08-28 | Discharge: 2023-08-28 | Payer: PRIVATE HEALTH INSURANCE

## 2023-08-28 ENCOUNTER — Ambulatory Visit: Admit: 2023-08-28 | Discharge: 2023-08-28 | Payer: PRIVATE HEALTH INSURANCE

## 2023-09-01 DIAGNOSIS — C187 Malignant neoplasm of sigmoid colon: Principal | ICD-10-CM

## 2023-09-01 DIAGNOSIS — K529 Noninfective gastroenteritis and colitis, unspecified: Principal | ICD-10-CM

## 2023-09-01 DIAGNOSIS — C786 Secondary malignant neoplasm of retroperitoneum and peritoneum: Principal | ICD-10-CM

## 2023-09-01 DIAGNOSIS — C800 Disseminated malignant neoplasm, unspecified: Principal | ICD-10-CM

## 2023-09-01 DIAGNOSIS — Z79899 Other long term (current) drug therapy: Principal | ICD-10-CM

## 2023-09-01 DIAGNOSIS — C188 Malignant neoplasm of overlapping sites of colon: Principal | ICD-10-CM

## 2023-09-03 DIAGNOSIS — Z792 Long term (current) use of antibiotics: Principal | ICD-10-CM

## 2023-09-04 ENCOUNTER — Ambulatory Visit
Admit: 2023-09-04 | Discharge: 2023-09-07 | Disposition: A | Discharge status: Home / Self Care | Payer: PRIVATE HEALTH INSURANCE

## 2023-09-04 ENCOUNTER — Inpatient Hospital Stay
Admit: 2023-09-04 | Discharge: 2023-09-07 | Disposition: A | Discharge status: Home / Self Care | Payer: PRIVATE HEALTH INSURANCE

## 2023-09-11 ENCOUNTER — Ambulatory Visit
Admit: 2023-09-11 | Discharge: 2023-09-12 | Payer: PRIVATE HEALTH INSURANCE | Attending: Hematology & Oncology | Primary: Hematology & Oncology

## 2023-09-11 ENCOUNTER — Ambulatory Visit: Admit: 2023-09-11 | Discharge: 2023-09-12 | Payer: PRIVATE HEALTH INSURANCE

## 2023-09-11 DIAGNOSIS — C800 Disseminated malignant neoplasm, unspecified: Principal | ICD-10-CM

## 2023-09-11 DIAGNOSIS — C187 Malignant neoplasm of sigmoid colon: Principal | ICD-10-CM

## 2023-09-11 DIAGNOSIS — Z79899 Other long term (current) drug therapy: Principal | ICD-10-CM

## 2023-09-11 DIAGNOSIS — C188 Malignant neoplasm of overlapping sites of colon: Principal | ICD-10-CM

## 2023-09-15 DIAGNOSIS — C800 Disseminated malignant neoplasm, unspecified: Principal | ICD-10-CM

## 2023-09-15 DIAGNOSIS — C188 Malignant neoplasm of overlapping sites of colon: Principal | ICD-10-CM

## 2023-09-15 DIAGNOSIS — C187 Malignant neoplasm of sigmoid colon: Principal | ICD-10-CM

## 2023-09-15 DIAGNOSIS — Z79899 Other long term (current) drug therapy: Principal | ICD-10-CM

## 2023-09-22 ENCOUNTER — Ambulatory Visit: Admit: 2023-09-22 | Discharge: 2023-09-27

## 2023-09-22 ENCOUNTER — Encounter: Admit: 2023-09-22 | Discharge: 2023-09-27 | Disposition: A | Discharge status: Hospice | Payer: MEDICARE

## 2023-09-22 ENCOUNTER — Encounter: Admit: 2023-09-22 | Discharge: 2023-09-27

## 2023-09-22 ENCOUNTER — Inpatient Hospital Stay: Admit: 2023-09-22 | Discharge: 2023-09-27 | Disposition: A | Discharge status: Hospice

## 2023-09-22 ENCOUNTER — Ambulatory Visit: Admit: 2023-09-22 | Discharge: 2023-09-27 | Disposition: A | Discharge status: Hospice

## 2023-09-22 DIAGNOSIS — C800 Disseminated malignant neoplasm, unspecified: Principal | ICD-10-CM

## 2023-09-22 DIAGNOSIS — C187 Malignant neoplasm of sigmoid colon: Principal | ICD-10-CM

## 2023-09-22 DIAGNOSIS — Z79899 Other long term (current) drug therapy: Principal | ICD-10-CM

## 2023-09-22 DIAGNOSIS — C188 Malignant neoplasm of overlapping sites of colon: Principal | ICD-10-CM

## 2023-09-23 DIAGNOSIS — C800 Disseminated malignant neoplasm, unspecified: Principal | ICD-10-CM

## 2023-09-23 DIAGNOSIS — C187 Malignant neoplasm of sigmoid colon: Principal | ICD-10-CM

## 2023-09-23 DIAGNOSIS — C188 Malignant neoplasm of overlapping sites of colon: Principal | ICD-10-CM

## 2023-09-23 DIAGNOSIS — Z79899 Other long term (current) drug therapy: Principal | ICD-10-CM

## 2023-09-25 DIAGNOSIS — C188 Malignant neoplasm of overlapping sites of colon: Principal | ICD-10-CM

## 2023-09-25 DIAGNOSIS — C187 Malignant neoplasm of sigmoid colon: Principal | ICD-10-CM

## 2023-09-25 DIAGNOSIS — C800 Disseminated malignant neoplasm, unspecified: Principal | ICD-10-CM

## 2023-09-25 DIAGNOSIS — Z79899 Other long term (current) drug therapy: Principal | ICD-10-CM

## 2023-09-26 DIAGNOSIS — C187 Malignant neoplasm of sigmoid colon: Principal | ICD-10-CM

## 2023-09-26 DIAGNOSIS — Z79899 Other long term (current) drug therapy: Principal | ICD-10-CM

## 2023-09-26 DIAGNOSIS — C800 Disseminated malignant neoplasm, unspecified: Principal | ICD-10-CM

## 2023-09-26 DIAGNOSIS — C188 Malignant neoplasm of overlapping sites of colon: Principal | ICD-10-CM

## 2023-09-27 DIAGNOSIS — C800 Disseminated malignant neoplasm, unspecified: Principal | ICD-10-CM

## 2023-09-27 DIAGNOSIS — C187 Malignant neoplasm of sigmoid colon: Principal | ICD-10-CM

## 2023-09-27 DIAGNOSIS — C188 Malignant neoplasm of overlapping sites of colon: Principal | ICD-10-CM

## 2023-09-27 DIAGNOSIS — Z79899 Other long term (current) drug therapy: Principal | ICD-10-CM

## 2023-09-27 MED ORDER — PROCHLORPERAZINE MALEATE 5 MG TABLET
ORAL_TABLET | Freq: Four times a day (QID) | ORAL | 0 refills | 4 days | Status: CP | PRN
Start: 2023-09-27 — End: 2023-10-04
  Filled 2023-09-27: qty 28, 4d supply, fill #0

## 2023-09-27 MED ORDER — ESOMEPRAZOLE MAGNESIUM 20 MG CAPSULE,DELAYED RELEASE
ORAL_CAPSULE | Freq: Every day | ORAL | 0 refills | 30 days | Status: CP
Start: 2023-09-27 — End: 2023-10-27
  Filled 2023-09-27: qty 30, 30d supply, fill #0

## 2023-09-27 MED ORDER — DEXAMETHASONE 1 MG/ML ORAL SOLUTION (WITHOUT ALCOHOL)
Freq: Two times a day (BID) | ORAL | 0 refills | 10 days | Status: CP
Start: 2023-09-27 — End: 2023-09-27

## 2023-09-27 MED ORDER — NALOXONE 0.4 MG/ML INJECTION SOLUTION
INTRAVENOUS | 0 refills | 1 days | Status: CP | PRN
Start: 2023-09-27 — End: 2023-09-27

## 2023-09-27 MED ORDER — MORPHINE CONCENTRATE 20 MG/ML ORAL SOLUTION WRAPPER
ORAL | 0 refills | 10 days | Status: CP | PRN
Start: 2023-09-27 — End: 2023-10-07
  Filled 2023-09-27: qty 30, 7d supply, fill #0

## 2023-09-27 MED ORDER — OLANZAPINE 5 MG DISINTEGRATING TABLET
ORAL_TABLET | Freq: Every evening | ORAL | 0 refills | 60 days | Status: CP
Start: 2023-09-27 — End: 2023-11-26
  Filled 2023-09-27: qty 30, 60d supply, fill #0

## 2023-09-27 MED ORDER — PANTOPRAZOLE 2MG/ML SUSPENSION
Freq: Every day | ORAL | 0 refills | 5 days | Status: CP
Start: 2023-09-27 — End: 2023-09-27

## 2023-09-27 MED ORDER — DEXAMETHASONE 2 MG TABLET
ORAL_TABLET | Freq: Two times a day (BID) | ORAL | 0 refills | 5 days | Status: CP
Start: 2023-09-27 — End: 2023-10-02
  Filled 2023-09-27: qty 20, 5d supply, fill #0

## 2023-09-27 MED ORDER — NALOXONE 4 MG/ACTUATION NASAL SPRAY
0 refills | 0 days | Status: CP
Start: 2023-09-27 — End: ?
  Filled 2023-09-27: qty 2, 2d supply, fill #0

## 2023-09-30 MED ORDER — OPIUM TINCTURE 10 MG/ML (MORPHINE) ORAL
Freq: Four times a day (QID) | ORAL | 0 refills | 5 days | Status: CP | PRN
Start: 2023-09-30 — End: 2023-10-05

## 2023-10-06 DIAGNOSIS — Z79899 Other long term (current) drug therapy: Principal | ICD-10-CM

## 2023-10-06 DIAGNOSIS — C187 Malignant neoplasm of sigmoid colon: Principal | ICD-10-CM

## 2023-10-06 DIAGNOSIS — C800 Disseminated malignant neoplasm, unspecified: Principal | ICD-10-CM

## 2023-10-06 DIAGNOSIS — D500 Iron deficiency anemia secondary to blood loss (chronic): Principal | ICD-10-CM

## 2023-10-06 DIAGNOSIS — C188 Malignant neoplasm of overlapping sites of colon: Principal | ICD-10-CM

## 2023-10-07 ENCOUNTER — Encounter: Admit: 2023-10-07 | Discharge: 2023-10-08

## 2023-10-07 ENCOUNTER — Ambulatory Visit: Admit: 2023-10-07 | Discharge: 2023-10-08

## 2023-10-07 DIAGNOSIS — D500 Iron deficiency anemia secondary to blood loss (chronic): Principal | ICD-10-CM

## 2023-10-07 DIAGNOSIS — C800 Disseminated malignant neoplasm, unspecified: Principal | ICD-10-CM

## 2023-10-07 DIAGNOSIS — Z79899 Other long term (current) drug therapy: Principal | ICD-10-CM

## 2023-10-07 DIAGNOSIS — C187 Malignant neoplasm of sigmoid colon: Principal | ICD-10-CM

## 2023-10-07 DIAGNOSIS — C188 Malignant neoplasm of overlapping sites of colon: Principal | ICD-10-CM

## 2023-10-08 ENCOUNTER — Inpatient Hospital Stay: Admit: 2023-10-08 | Discharge: 2023-10-08

## 2023-10-14 DIAGNOSIS — Z79899 Other long term (current) drug therapy: Principal | ICD-10-CM

## 2023-10-14 DIAGNOSIS — C187 Malignant neoplasm of sigmoid colon: Principal | ICD-10-CM

## 2023-10-14 DIAGNOSIS — C800 Disseminated malignant neoplasm, unspecified: Principal | ICD-10-CM

## 2023-10-14 DIAGNOSIS — C188 Malignant neoplasm of overlapping sites of colon: Principal | ICD-10-CM

## 2023-10-16 ENCOUNTER — Ambulatory Visit: Admit: 2023-10-16 | Discharge: 2023-10-17 | Payer: MEDICARE

## 2023-10-16 ENCOUNTER — Ambulatory Visit
Admit: 2023-10-16 | Discharge: 2023-10-17 | Payer: MEDICARE | Attending: Hematology & Oncology | Primary: Hematology & Oncology

## 2023-10-16 DIAGNOSIS — C188 Malignant neoplasm of overlapping sites of colon: Principal | ICD-10-CM

## 2023-10-16 DIAGNOSIS — Z79899 Other long term (current) drug therapy: Principal | ICD-10-CM

## 2023-10-16 DIAGNOSIS — C800 Disseminated malignant neoplasm, unspecified: Principal | ICD-10-CM

## 2023-10-16 DIAGNOSIS — C187 Malignant neoplasm of sigmoid colon: Principal | ICD-10-CM

## 2023-10-16 MED ORDER — SODIUM BICARBONATE 650 MG TABLET
ORAL_TABLET | Freq: Three times a day (TID) | ORAL | 11 refills | 34.00 days | Status: CP
Start: 2023-10-16 — End: 2024-10-15

## 2023-10-16 MED ORDER — HYDROCORTISONE 1 %-PRAMOXINE 1 % RECTAL FOAM
Freq: Two times a day (BID) | RECTAL | 5 refills | 13.00 days | Status: CP
Start: 2023-10-16 — End: ?

## 2023-10-16 MED ORDER — ESOMEPRAZOLE MAGNESIUM 20 MG CAPSULE,DELAYED RELEASE
ORAL_CAPSULE | Freq: Every day | ORAL | 0 refills | 30.00 days | Status: CP
Start: 2023-10-16 — End: 2023-11-15

## 2023-10-17 DIAGNOSIS — C187 Malignant neoplasm of sigmoid colon: Principal | ICD-10-CM

## 2023-10-17 DIAGNOSIS — Z79899 Other long term (current) drug therapy: Principal | ICD-10-CM

## 2023-10-17 DIAGNOSIS — C188 Malignant neoplasm of overlapping sites of colon: Principal | ICD-10-CM

## 2023-10-17 DIAGNOSIS — C800 Disseminated malignant neoplasm, unspecified: Principal | ICD-10-CM

## 2023-10-20 DIAGNOSIS — C188 Malignant neoplasm of overlapping sites of colon: Principal | ICD-10-CM

## 2023-10-20 DIAGNOSIS — C800 Disseminated malignant neoplasm, unspecified: Principal | ICD-10-CM

## 2023-10-20 DIAGNOSIS — Z79899 Other long term (current) drug therapy: Principal | ICD-10-CM

## 2023-10-20 DIAGNOSIS — C187 Malignant neoplasm of sigmoid colon: Principal | ICD-10-CM

## 2023-10-21 DIAGNOSIS — C188 Malignant neoplasm of overlapping sites of colon: Principal | ICD-10-CM

## 2023-10-21 DIAGNOSIS — C800 Disseminated malignant neoplasm, unspecified: Principal | ICD-10-CM

## 2023-10-21 DIAGNOSIS — Z79899 Other long term (current) drug therapy: Principal | ICD-10-CM

## 2023-10-21 DIAGNOSIS — C187 Malignant neoplasm of sigmoid colon: Principal | ICD-10-CM

## 2023-10-22 DIAGNOSIS — C187 Malignant neoplasm of sigmoid colon: Principal | ICD-10-CM

## 2023-10-22 DIAGNOSIS — C800 Disseminated malignant neoplasm, unspecified: Principal | ICD-10-CM

## 2023-10-22 DIAGNOSIS — Z79899 Other long term (current) drug therapy: Principal | ICD-10-CM

## 2023-10-22 DIAGNOSIS — C188 Malignant neoplasm of overlapping sites of colon: Principal | ICD-10-CM

## 2023-10-27 DIAGNOSIS — C188 Malignant neoplasm of overlapping sites of colon: Principal | ICD-10-CM

## 2023-10-27 DIAGNOSIS — Z79899 Other long term (current) drug therapy: Principal | ICD-10-CM

## 2023-10-27 DIAGNOSIS — C800 Disseminated malignant neoplasm, unspecified: Principal | ICD-10-CM

## 2023-10-27 DIAGNOSIS — C187 Malignant neoplasm of sigmoid colon: Principal | ICD-10-CM

## 2023-10-28 DIAGNOSIS — C187 Malignant neoplasm of sigmoid colon: Principal | ICD-10-CM

## 2023-10-28 DIAGNOSIS — Z79899 Other long term (current) drug therapy: Principal | ICD-10-CM

## 2023-10-28 DIAGNOSIS — C188 Malignant neoplasm of overlapping sites of colon: Principal | ICD-10-CM

## 2023-10-28 DIAGNOSIS — C800 Disseminated malignant neoplasm, unspecified: Principal | ICD-10-CM

## 2023-10-31 DIAGNOSIS — C800 Disseminated malignant neoplasm, unspecified: Principal | ICD-10-CM

## 2023-10-31 DIAGNOSIS — C187 Malignant neoplasm of sigmoid colon: Principal | ICD-10-CM

## 2023-10-31 DIAGNOSIS — C188 Malignant neoplasm of overlapping sites of colon: Principal | ICD-10-CM

## 2023-10-31 DIAGNOSIS — Z79899 Other long term (current) drug therapy: Principal | ICD-10-CM

## 2023-11-01 DIAGNOSIS — C800 Disseminated malignant neoplasm, unspecified: Principal | ICD-10-CM

## 2023-11-01 DIAGNOSIS — C188 Malignant neoplasm of overlapping sites of colon: Principal | ICD-10-CM

## 2023-11-01 DIAGNOSIS — Z79899 Other long term (current) drug therapy: Principal | ICD-10-CM

## 2023-11-01 DIAGNOSIS — C187 Malignant neoplasm of sigmoid colon: Principal | ICD-10-CM

## 2023-11-30 DEATH — deceased
